# Patient Record
Sex: Female | Born: 1990 | Race: Black or African American | Hispanic: No | Marital: Single | Smoking: Current every day smoker
Health system: Southern US, Community
[De-identification: ages and names within clinical notes are randomized; demographics above are authoritative.]

## PROBLEM LIST (undated history)

## (undated) DIAGNOSIS — F419 Anxiety disorder, unspecified: Secondary | ICD-10-CM

## (undated) HISTORY — DX: Anxiety disorder, unspecified: F41.9

## (undated) HISTORY — PX: WISDOM TOOTH EXTRACTION: SHX21

---
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
## 2009-07-05 IMAGING — US US OB COMP LESS 14 WK
1 series · 14 of 28 positions shown · non-contrast
Comparison: none

OBSTETRICAL ULTRASOUND:

 This ultrasound exam was performed in the [HOSPITAL] Ultrasound Department.  The OB US report was generated in the AS system, and faxed to the ordering physician.  This report is also available in [REDACTED] PACS.

[Series 1: us ob comp less 14 wk · 0.24mm/px · 14 of 50 slices shown]
[im 2/50]
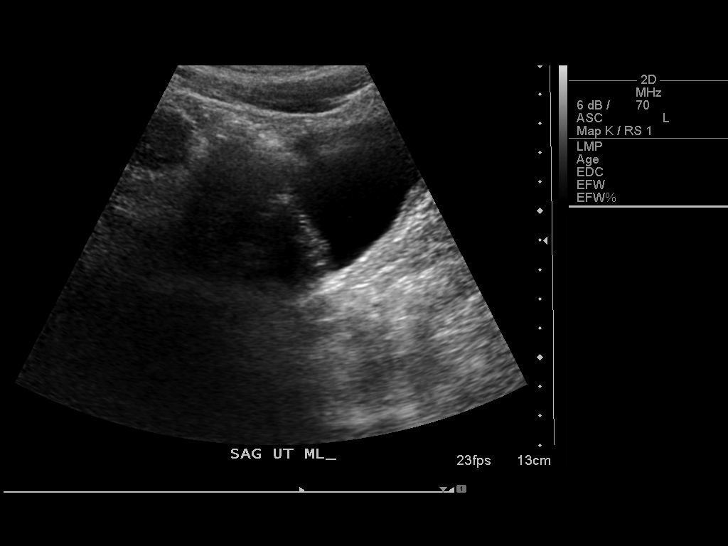
[im 6/50]
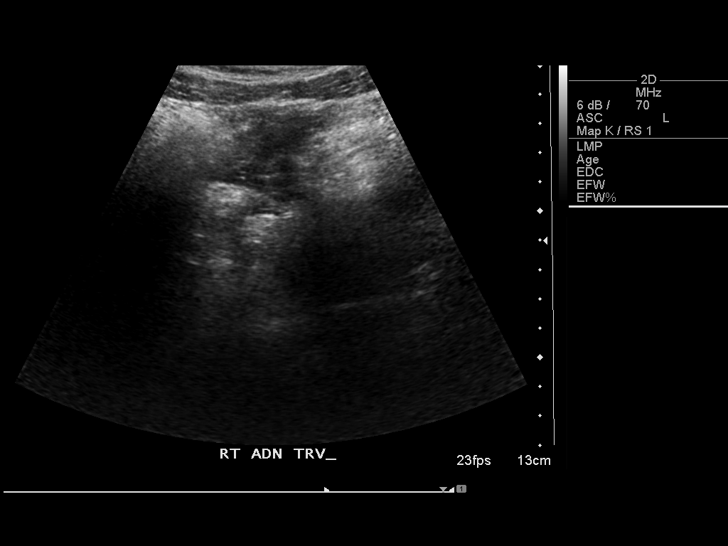
[im 10/50]
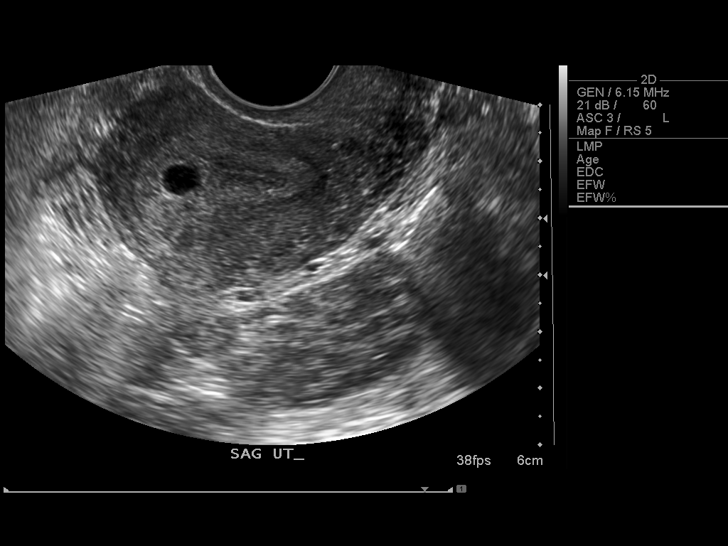
[im 13/50]
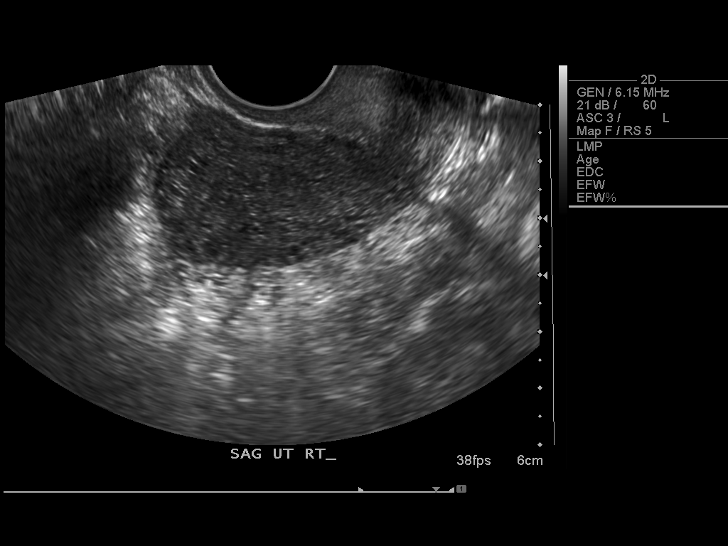
[im 17/50]
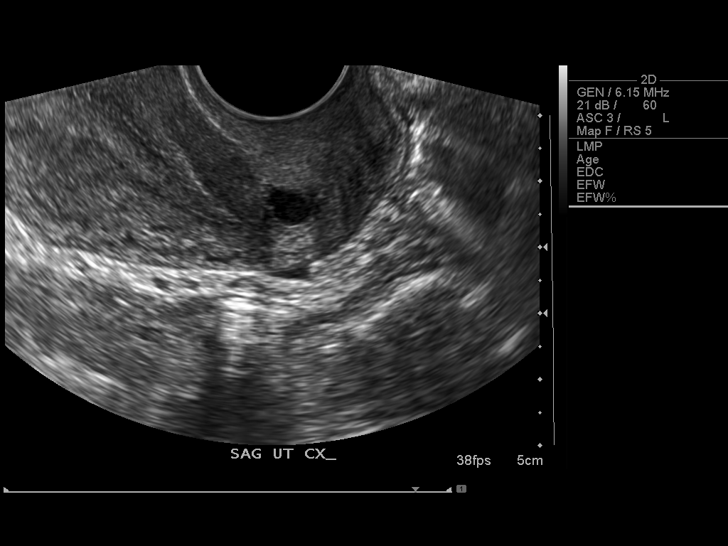
[im 20/50]
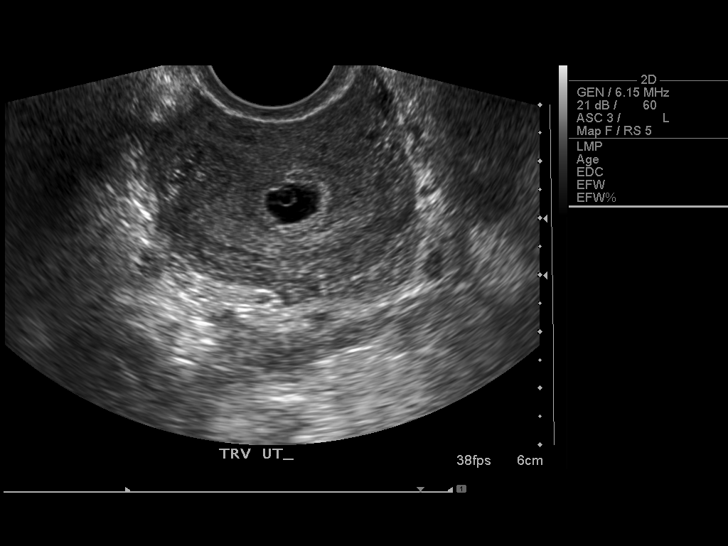
[im 24/50]
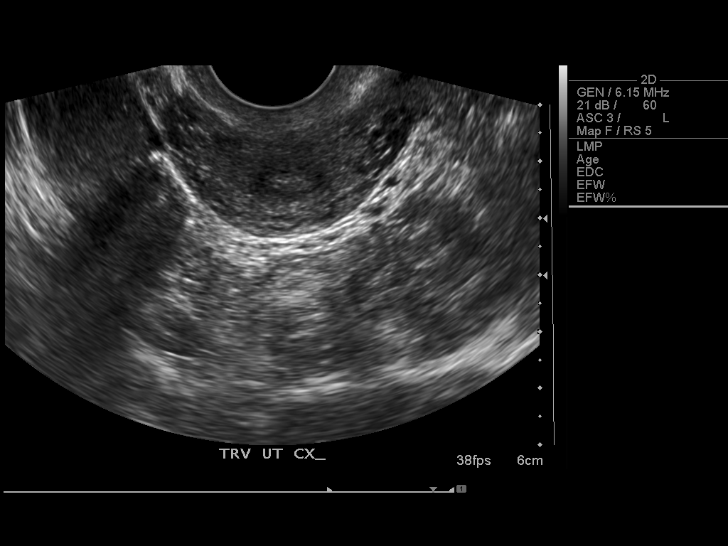
[im 28/50]
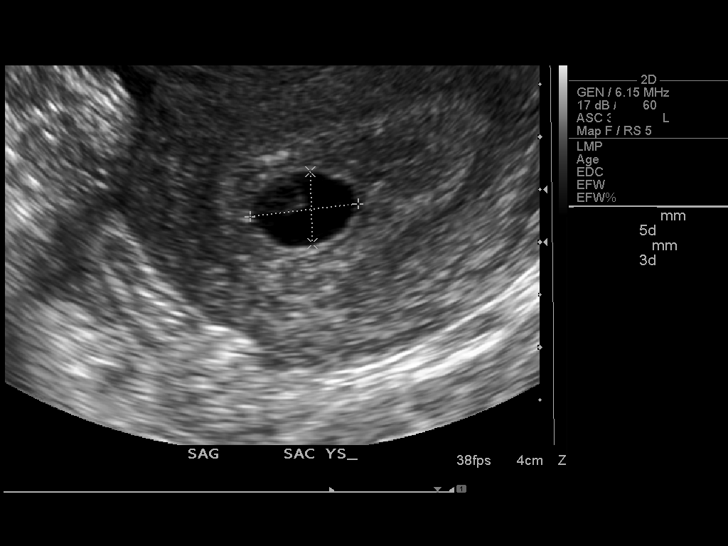
[im 31/50]
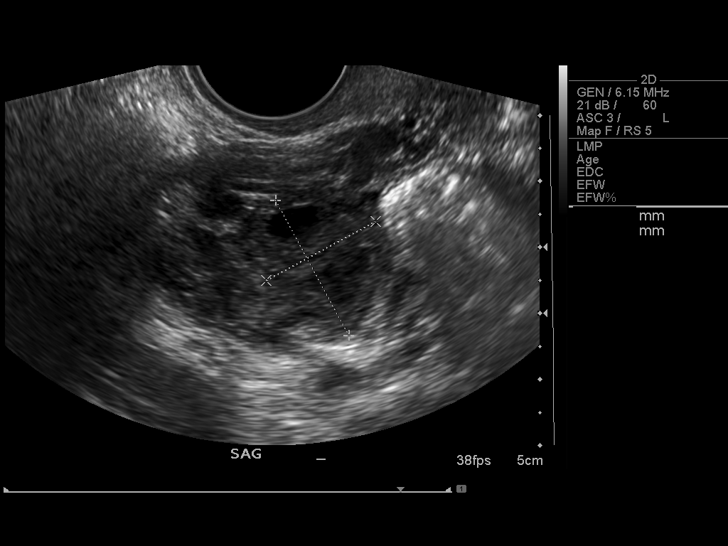
[im 35/50]
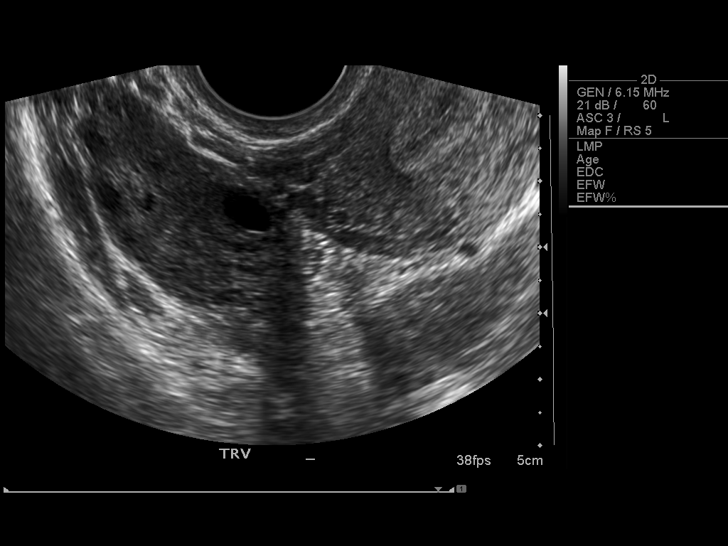
[im 39/50]
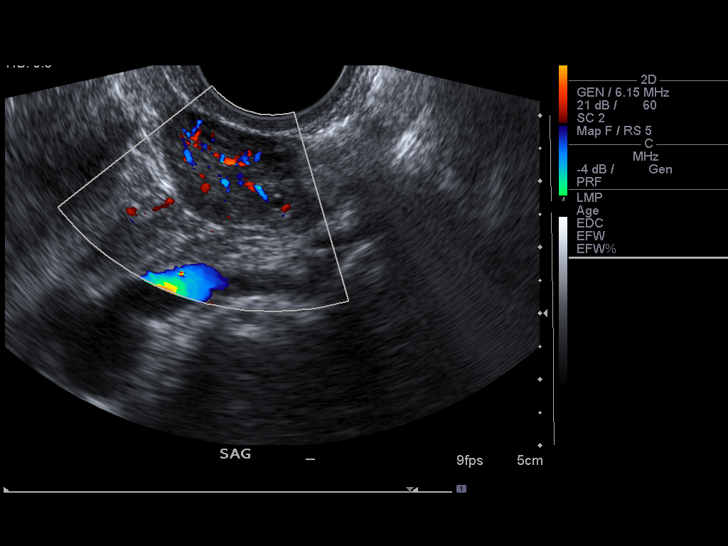
[im 42/50]
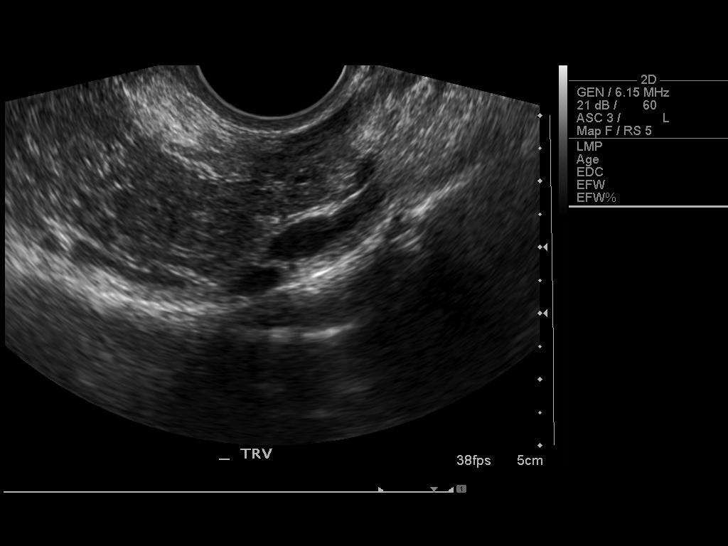
[im 46/50]
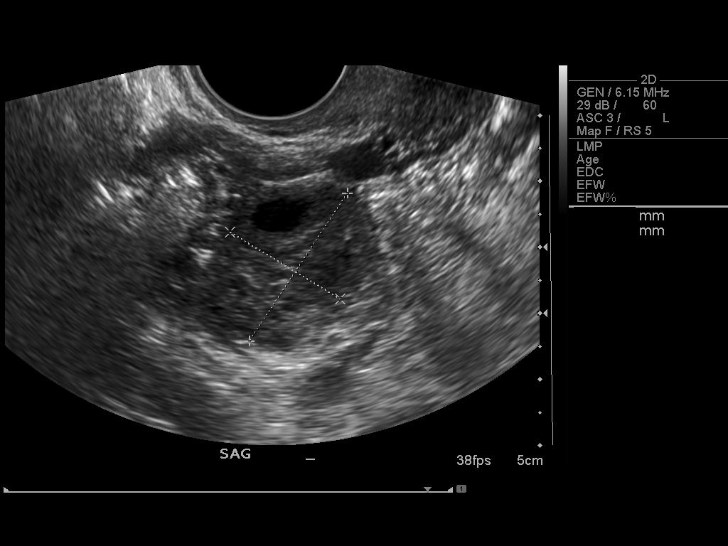
[im 50/50]
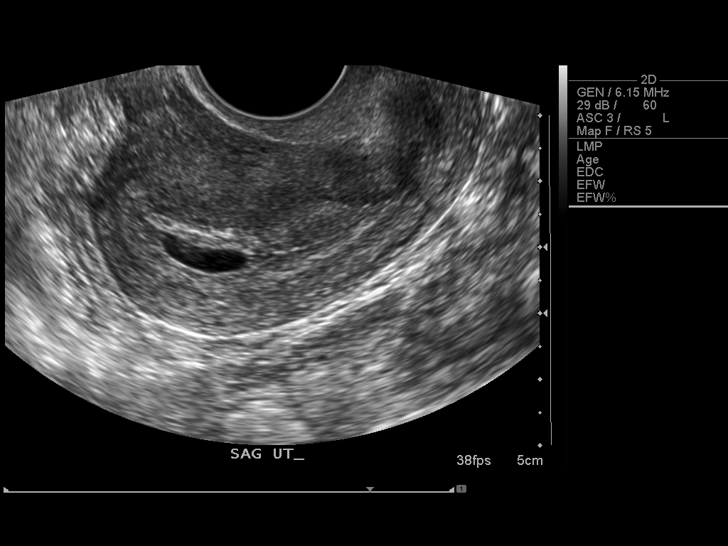

[14 of 28 positions shown; findings below may reference images not displayed]

IMPRESSION: See AS Obstetric US report.

## 2011-07-13 LAB — POCT PREGNANCY, URINE
Operator id: 196461
Preg Test, Ur: POSITIVE

## 2011-07-14 LAB — URINE MICROSCOPIC-ADD ON: RBC / HPF: NONE SEEN

## 2011-07-14 LAB — URINALYSIS, ROUTINE W REFLEX MICROSCOPIC
Bilirubin Urine: NEGATIVE
Glucose, UA: NEGATIVE
Glucose, UA: NEGATIVE
Hgb urine dipstick: NEGATIVE
Ketones, ur: NEGATIVE
Ketones, ur: NEGATIVE
Nitrite: NEGATIVE
Protein, ur: NEGATIVE
Protein, ur: NEGATIVE
Specific Gravity, Urine: >1.03 — ABNORMAL HIGH
Urobilinogen, UA: 0.2
pH: 5.5

## 2011-07-14 LAB — CBC
HCT: 36.8
Hemoglobin: 12.4
Hemoglobin: 12.4
MCHC: 33.8
MCV: 90.9
RBC: 3.97
RDW: 12.6
RDW: 12.8

## 2011-07-14 LAB — WET PREP, GENITAL
Clue Cells Wet Prep HPF POC: NONE SEEN
Clue Cells Wet Prep HPF POC: NONE SEEN
Yeast Wet Prep HPF POC: NONE SEEN

## 2011-07-14 LAB — GC/CHLAMYDIA PROBE AMP, GENITAL
Chlamydia, DNA Probe: NEGATIVE
Chlamydia, DNA Probe: NEGATIVE
GC Probe Amp, Genital: NEGATIVE
GC Probe Amp, Genital: NEGATIVE

## 2011-07-14 LAB — ABO/RH: ABO/RH(D): B POS

## 2013-08-07 ENCOUNTER — Encounter (HOSPITAL_COMMUNITY): Payer: Self-pay | Admitting: Emergency Medicine

## 2013-08-07 ENCOUNTER — Emergency Department (HOSPITAL_COMMUNITY)
Admission: EM | Admit: 2013-08-07 | Discharge: 2013-08-07 | Disposition: A | Discharge status: Home / Self Care | Payer: Medicaid Other | Source: Home / Self Care | Attending: Family Medicine | Admitting: Family Medicine

## 2013-08-07 DIAGNOSIS — Y92009 Unspecified place in unspecified non-institutional (private) residence as the place of occurrence of the external cause: Secondary | ICD-10-CM

## 2013-08-07 DIAGNOSIS — S29011A Strain of muscle and tendon of front wall of thorax, initial encounter: Secondary | ICD-10-CM

## 2013-08-07 DIAGNOSIS — IMO0002 Reserved for concepts with insufficient information to code with codable children: Secondary | ICD-10-CM

## 2013-08-07 DIAGNOSIS — W19XXXA Unspecified fall, initial encounter: Secondary | ICD-10-CM

## 2013-08-07 DIAGNOSIS — W010XXA Fall on same level from slipping, tripping and stumbling without subsequent striking against object, initial encounter: Secondary | ICD-10-CM

## 2013-08-07 MED ORDER — CYCLOBENZAPRINE HCL 5 MG PO TABS: 5.0000 mg | ORAL_TABLET | Freq: Three times a day (TID) | ORAL | Status: DC | PRN

## 2013-08-07 MED ORDER — DICLOFENAC POTASSIUM 50 MG PO TABS: 50.0000 mg | ORAL_TABLET | Freq: Three times a day (TID) | ORAL | Status: DC

## 2013-08-07 NOTE — ED Notes (Signed)
C/o falling down 13 stairs Tuesday.  States she was wearing heels when she fell and landed on her back.

## 2013-08-07 NOTE — ED Provider Notes (Signed)
CSN: [id number]     Arrival date & time 08/07/13  1925 History   First MD Initiated Contact with Patient 08/07/13 2010     Chief Complaint  Patient presents with  . Fall   (Consider location/radiation/quality/duration/timing/severity/associated sxs/prior Treatment) Patient is a 22 y.o. female presenting with fall. The history is provided by the patient.  Fall This is a new problem. The current episode started 2 days ago (slipped in high heels on stairs and fell on tues, no pain until wed, diffuse soreness.). The problem has not changed since onset.Pertinent negatives include no chest pain, no abdominal pain, no headaches and no shortness of breath.    History reviewed. No pertinent past medical history. No past surgical history on file. No family history on file. History  Substance Use Topics  . Smoking status: Not on file  . Smokeless tobacco: Not on file  . Alcohol Use: Not on file   OB History   Grav Para Term Preterm Abortions TAB SAB Ect Mult Living                 Review of Systems  Constitutional: Negative.   Respiratory: Negative for shortness of breath.   Cardiovascular: Negative for chest pain.  Gastrointestinal: Negative.  Negative for abdominal pain.  Musculoskeletal: Positive for back pain and myalgias. Negative for gait problem, neck pain and neck stiffness.  Skin: Negative.   Neurological: Negative for light-headedness, numbness and headaches.    Allergies  Review of patient's allergies indicates no known allergies.  Home Medications  No current outpatient prescriptions on file. BP 121/74  Pulse 76  Temp(Src) 98.7 F (37.1 C) (Oral)  Resp 18  SpO2 100%  LMP 07/09/2013 Physical Exam  Nursing note and vitals reviewed. Constitutional: She is oriented to person, place, and time. She appears well-developed and well-nourished.  HENT:  Head: Normocephalic and atraumatic.  Neck: Normal range of motion. Neck supple.  Pulmonary/Chest: She exhibits no  tenderness.  Abdominal: Bowel sounds are normal.  Musculoskeletal: Normal range of motion. She exhibits no tenderness.       Arms: Neurological: She is alert and oriented to person, place, and time.  Skin: Skin is warm and dry. No rash noted. No erythema.  Psychiatric: Her mood appears anxious.    ED Course  Procedures (including critical care time) Labs Review Labs Reviewed - No data to display Imaging Review No results found.    MDM      Linna Hoff, MD 08/07/13 2022

## 2013-08-26 ENCOUNTER — Encounter: Payer: Self-pay | Admitting: Advanced Practice Midwife

## 2013-09-08 ENCOUNTER — Encounter: Payer: Self-pay | Admitting: Advanced Practice Midwife

## 2013-09-23 ENCOUNTER — Ambulatory Visit (INDEPENDENT_AMBULATORY_CARE_PROVIDER_SITE_OTHER): Payer: Medicaid Other | Admitting: Obstetrics

## 2013-09-23 ENCOUNTER — Ambulatory Visit: Payer: Self-pay | Admitting: Advanced Practice Midwife

## 2013-09-23 ENCOUNTER — Encounter: Payer: Self-pay | Admitting: Obstetrics

## 2013-09-23 VITALS — BP 119/83 | HR 116 | Temp 98.6°F | Ht 64.0 in | Wt 115.0 lb

## 2013-09-23 DIAGNOSIS — Z113 Encounter for screening for infections with a predominantly sexual mode of transmission: Secondary | ICD-10-CM

## 2013-09-23 DIAGNOSIS — Z3202 Encounter for pregnancy test, result negative: Secondary | ICD-10-CM

## 2013-09-23 DIAGNOSIS — Z0000 Encounter for general adult medical examination without abnormal findings: Secondary | ICD-10-CM

## 2013-09-23 LAB — POCT URINALYSIS DIPSTICK
Bilirubin, UA: NEGATIVE
Ketones, UA: NEGATIVE
Protein, UA: NEGATIVE
Spec Grav, UA: 1.02

## 2013-09-23 LAB — POCT URINE PREGNANCY: Preg Test, Ur: NEGATIVE

## 2013-09-23 NOTE — Progress Notes (Signed)
Subjective:     Jasmine Preston is a 22 y.o. female here for a routine exam.  Current complaints: concerned about history of abortions  and history HPV, history of abnormal pap smears, also would like to talk about contraception .  Personal health questionnaire reviewed: yes.   Gynecologic History Patient's last menstrual period was 09/11/2013. Contraception: none Last Pap: . Results were:  Last mammogram:  Results were:   Obstetric History OB History  No data available     The following portions of the patient's history were reviewed and updated as appropriate: allergies, current medications, past family history, past medical history, past social history, past surgical history and problem list.  Review of Systems Pertinent items are noted in HPI.    Objective:    General appearance: alert and no distress Breasts: normal appearance, no masses or tenderness Abdomen: normal findings: soft, non-tender Pelvic: cervix normal in appearance, external genitalia normal, no adnexal masses or tenderness, no cervical motion tenderness, rectovaginal septum normal, uterus normal size, shape, and consistency and vagina normal without discharge    Assessment:    Healthy female exam.    Plan:    Education reviewed: safe sex/STD prevention and contraceptive options. Follow up in: 1 year.

## 2013-09-24 LAB — WET PREP BY MOLECULAR PROBE: Gardnerella vaginalis: POSITIVE — AB

## 2013-09-24 LAB — PAP IG W/ RFLX HPV ASCU

## 2013-09-24 LAB — HEPATITIS C ANTIBODY: HCV Ab: NEGATIVE

## 2013-09-24 LAB — GC/CHLAMYDIA PROBE AMP: GC Probe RNA: NEGATIVE

## 2013-09-24 LAB — RPR

## 2013-09-24 LAB — HEPATITIS B SURFACE ANTIGEN: Hepatitis B Surface Ag: NEGATIVE

## 2013-09-29 NOTE — Progress Notes (Signed)
Flagyl 500mg po bid x 7days

## 2013-10-02 ENCOUNTER — Other Ambulatory Visit: Payer: Self-pay | Admitting: *Deleted

## 2013-10-02 DIAGNOSIS — B9689 Other specified bacterial agents as the cause of diseases classified elsewhere: Secondary | ICD-10-CM

## 2013-10-02 MED ORDER — METRONIDAZOLE 500 MG PO TABS: 500.0000 mg | ORAL_TABLET | Freq: Two times a day (BID) | ORAL | Status: DC

## 2013-10-11 ENCOUNTER — Emergency Department (HOSPITAL_COMMUNITY)
Admission: EM | Admit: 2013-10-11 | Discharge: 2013-10-11 | Disposition: A | Discharge status: Home / Self Care | Payer: Medicaid Other | Source: Home / Self Care | Attending: Family Medicine | Admitting: Family Medicine

## 2013-10-11 ENCOUNTER — Encounter (HOSPITAL_COMMUNITY): Payer: Self-pay | Admitting: Emergency Medicine

## 2013-10-11 ENCOUNTER — Emergency Department (INDEPENDENT_AMBULATORY_CARE_PROVIDER_SITE_OTHER): Payer: Medicaid Other

## 2013-10-11 DIAGNOSIS — M546 Pain in thoracic spine: Secondary | ICD-10-CM

## 2013-10-11 NOTE — ED Notes (Signed)
Spoke with patient at discharge desk.  She complains of right upper back pain worse with movement or deep breaths.  Patient speaking in complete sentences without difficulty.  Patient states she fell down steps 2-3 weeks ago and has pain intermittently.  Patient also wants to know why she did not have an x-ray on her initial visit after her fall.  Patient explained the risks of x-rays and the need of a physician order to perform them.  Patient asked to wait in waiting room and to let staff know if anything changed

## 2013-10-11 NOTE — ED Provider Notes (Signed)
Jasmine Preston is a 22 y.o. female who presents to Urgent Care today for right posterior chest wall pain. This is been worse over the past 3 days. Patient fell about 8 weeks ago and was seen in the urgent care. X-rays were not ordered at that time as they were likely unnecessary. However she's had pain off and on since then. The pain worsened 3 days ago. She notes pain on the medial border of her right scapula with deep inspiration and shoulder motion. She denies any significant radiating pain weakness or numbness. She tried taking 800 mg of ibuprofen 2 days ago once which did not help. She has not tried any of the medications treatment for therapy since. No nausea vomiting diarrhea. No significant shortness of breath.   Past Medical History  Diagnosis Date  . Anxiety    History  Substance Use Topics  . Smoking status: Current Every Day Smoker  . Smokeless tobacco: Not on file     Comment: trying to quit  . Alcohol Use: Yes     Comment: occasional , liquor or mixed drink socially   ROS as above Medications reviewed. No current facility-administered medications for this encounter.   Current Outpatient Prescriptions  Medication Sig Dispense Refill  . cyclobenzaprine (FLEXERIL) 5 MG tablet Take 1 tablet (5 mg total) by mouth 3 (three) times daily as needed for muscle spasms.  30 tablet  0  . diclofenac (CATAFLAM) 50 MG tablet Take 1 tablet (50 mg total) by mouth 3 (three) times daily. As needed for pain  30 tablet  0    Exam:  BP 121/79  Pulse 81  Temp(Src) 98.4 F (36.9 C) (Oral)  Resp 18  SpO2 100%  LMP 09/11/2013 Gen: Well NAD HEENT: EOMI,  MMM Lungs: Normal work of breathing. CTABL Chest wall: Posterior chest wall mildly tender to palpation. Right scapular winging present with arm abduction.  Pain improved with scapular assist arm abduction Heart: RRR no MRG Abd: NABS, Soft. NT, ND Exts: Non edematous BL  LE, warm and well perfused.   No results found for this or any  previous visit (from the past 24 hour(s)). Dg Ribs Unilateral W/chest Right  10/11/2013   CLINICAL DATA:  Right posterior chest pain.  Fall 8 weeks ago.  EXAM: RIGHT RIBS AND CHEST - 3+ VIEW  COMPARISON:  None.  FINDINGS: No fracture or other bone lesions are seen involving the ribs. There is no evidence of pneumothorax or pleural effusion. Both lungs are clear. Heart size and mediastinal contours are within normal limits.  IMPRESSION: Negative.   Electronically Signed   By: Charlett Nose M.D.   On: 10/11/2013 11:46    Assessment and Plan: 22 y.o. female with right posterior chest wall pain. Likely due to rhomboid muscle dysfunction. Patient additionally has a component of scapular dysfunction. Plan to treat with Flexeril and diclofenac. Additionally use a heating pad and  home exercise program. If not improved followup with Dr. Katrinka Blazing at [facility]. Discussed warning signs or symptoms. Please see discharge instructions. Patient expresses understanding.      Rodolph Bong, MD 10/11/13 [phone]

## 2013-11-20 ENCOUNTER — Other Ambulatory Visit: Payer: Self-pay | Admitting: *Deleted

## 2013-11-20 DIAGNOSIS — Z309 Encounter for contraceptive management, unspecified: Secondary | ICD-10-CM

## 2013-11-20 MED ORDER — MEDROXYPROGESTERONE ACETATE 150 MG/ML IM SUSP: 150.0000 mg | INTRAMUSCULAR | Status: DC

## 2013-11-21 ENCOUNTER — Ambulatory Visit (INDEPENDENT_AMBULATORY_CARE_PROVIDER_SITE_OTHER): Payer: Medicaid Other | Admitting: *Deleted

## 2013-11-21 VITALS — BP 114/74 | HR 77 | Temp 98.8°F | Ht 64.0 in | Wt 108.0 lb

## 2013-11-21 DIAGNOSIS — Z3049 Encounter for surveillance of other contraceptives: Secondary | ICD-10-CM

## 2013-11-21 DIAGNOSIS — L539 Erythematous condition, unspecified: Secondary | ICD-10-CM

## 2013-11-21 DIAGNOSIS — Z3202 Encounter for pregnancy test, result negative: Secondary | ICD-10-CM

## 2013-11-21 LAB — POCT URINE PREGNANCY: Preg Test, Ur: NEGATIVE

## 2013-11-21 MED ORDER — MEDROXYPROGESTERONE ACETATE 150 MG/ML IM SUSP
150.0000 mg | INTRAMUSCULAR | Status: AC
  Administered 2013-11-21 – 2014-10-14 (×2): 150 mg via INTRAMUSCULAR

## 2013-11-21 NOTE — Progress Notes (Signed)
Patient in office today for depo shot. UPT: Negative.

## 2013-12-04 ENCOUNTER — Ambulatory Visit: Payer: Medicaid Other | Admitting: Obstetrics

## 2014-02-12 ENCOUNTER — Ambulatory Visit (INDEPENDENT_AMBULATORY_CARE_PROVIDER_SITE_OTHER): Payer: Medicaid Other | Admitting: *Deleted

## 2014-02-12 ENCOUNTER — Ambulatory Visit: Payer: Medicaid Other

## 2014-02-12 VITALS — BP 116/69 | HR 71 | Wt 110.0 lb

## 2014-02-12 DIAGNOSIS — Z309 Encounter for contraceptive management, unspecified: Secondary | ICD-10-CM

## 2014-02-12 NOTE — Progress Notes (Signed)
Pt is in office today for depo injection.  Injection given in RUOQ. Pt tolerated well.  Pt advised to RTO on 05/06/14 for next injection.

## 2014-02-16 ENCOUNTER — Ambulatory Visit: Payer: Medicaid Other

## 2014-05-06 ENCOUNTER — Ambulatory Visit: Payer: Medicaid Other

## 2014-05-15 ENCOUNTER — Telehealth: Payer: Self-pay | Admitting: *Deleted

## 2014-05-15 NOTE — Telephone Encounter (Signed)
Pt called to office for return call.  Return call made to pt.  Pt states that she missed her appt for her depo injection.  Pt states that she is now having some spotting and does not wish to have her cycles due to pain.  Pt made aware of depo policy.  Pt advised to come to office for UPT and nurse may be able to f/u with Dr Clearance CootsHarper regarding injection.  Pt advised to know when she last had intercourse in order to let nurse know dates for accuracy.  Pt states understanding and appt made for UPT on Monday 05/18/14.

## 2014-07-07 ENCOUNTER — Other Ambulatory Visit (INDEPENDENT_AMBULATORY_CARE_PROVIDER_SITE_OTHER): Payer: Medicaid Other

## 2014-07-07 VITALS — BP 110/73 | HR 99 | Temp 98.5°F | Wt 107.8 lb

## 2014-07-07 DIAGNOSIS — Z3042 Encounter for surveillance of injectable contraceptive: Secondary | ICD-10-CM

## 2014-07-07 DIAGNOSIS — Z3202 Encounter for pregnancy test, result negative: Secondary | ICD-10-CM

## 2014-07-07 DIAGNOSIS — Z3049 Encounter for surveillance of other contraceptives: Secondary | ICD-10-CM

## 2014-07-07 LAB — POCT URINE PREGNANCY: PREG TEST UR: NEGATIVE

## 2014-07-07 NOTE — Progress Notes (Signed)
Patient is in the office today for a UPT, UPT preformed, results were negative. Patient states she would like to restart her DEPO Injections. Patient advised that she would need to abstain from sexual intercourse for two weeks and that when she came back we would do another UPT and that we could give her the Injection at that time. Patient notified that it is important to do it this way because we have today's negative test and that as long as she abstains from sexual intercourse and has the second negative UPT then we can insure that there is no possibility that she is pregnant. Patient voiced understanding. Patient notified to make an appointment with the front for a UPT/ DEPO restart.   Results for orders placed in visit on 07/07/14 (from the past 24 hour(s))  POCT URINE PREGNANCY     Status: None   Collection Time    07/07/14  2:03 PM      Result Value Ref Range   Preg Test, Ur Negative     BP 110/73  Pulse 99  Temp(Src) 98.5 F (36.9 C)  Wt 107 lb 12.8 oz (48.898 kg)

## 2014-07-21 ENCOUNTER — Ambulatory Visit: Payer: Medicaid Other

## 2014-07-22 ENCOUNTER — Ambulatory Visit (INDEPENDENT_AMBULATORY_CARE_PROVIDER_SITE_OTHER): Payer: Medicaid Other | Admitting: *Deleted

## 2014-07-22 VITALS — BP 96/59 | HR 80 | Temp 97.9°F | Wt 110.0 lb

## 2014-07-22 DIAGNOSIS — Z3202 Encounter for pregnancy test, result negative: Secondary | ICD-10-CM

## 2014-07-22 DIAGNOSIS — Z3049 Encounter for surveillance of other contraceptives: Secondary | ICD-10-CM

## 2014-07-22 DIAGNOSIS — Z3042 Encounter for surveillance of injectable contraceptive: Secondary | ICD-10-CM

## 2014-07-22 NOTE — Progress Notes (Signed)
Pt is in office for Depo restart.  Pt was in office 2 weeks ago with Negative UPT.  UPT in office today is negative.  Pt made aware of results and that Depo can be given.  Injection given in right upper outer quadrant.  Pt tolerated well. Pt advised to RTO 10-13-14 for next injection.    BP 96/59  Pulse 80  Temp(Src) 97.9 F (36.6 C)  Wt 110 lb (49.896 kg)

## 2014-10-13 ENCOUNTER — Ambulatory Visit: Payer: Medicaid Other

## 2014-10-13 ENCOUNTER — Other Ambulatory Visit: Payer: Self-pay | Admitting: *Deleted

## 2014-10-13 DIAGNOSIS — Z3042 Encounter for surveillance of injectable contraceptive: Secondary | ICD-10-CM

## 2014-10-13 MED ORDER — MEDROXYPROGESTERONE ACETATE 150 MG/ML IM SUSP: 150.0000 mg | INTRAMUSCULAR | Status: DC

## 2014-10-14 ENCOUNTER — Ambulatory Visit: Payer: Medicaid Other

## 2014-10-14 ENCOUNTER — Other Ambulatory Visit: Payer: Self-pay | Admitting: Obstetrics

## 2014-10-14 ENCOUNTER — Ambulatory Visit (INDEPENDENT_AMBULATORY_CARE_PROVIDER_SITE_OTHER): Payer: Medicaid Other | Admitting: *Deleted

## 2014-10-14 VITALS — BP 100/68 | HR 61 | Temp 98.5°F | Ht 63.0 in | Wt 116.0 lb

## 2014-10-14 DIAGNOSIS — Z3042 Encounter for surveillance of injectable contraceptive: Secondary | ICD-10-CM

## 2014-10-14 NOTE — Progress Notes (Signed)
Patient is in the office today for her DEPO Injection. Patient is on time for her Injection. Injection given in Left Upper Outer Quadrant. Patient tolerated well. Patient notified to return on January 06, 2015 for her DEPO and Annual exam. Patient voiced understanding and made an appointment with the front.   BP 100/68 mmHg  Pulse 61  Temp(Src) 98.5 F (36.9 C)  Ht 5\' 3"  (1.6 m)  Wt 116 lb (52.617 kg)  BMI 20.55 kg/m2  LMP 10/13/2014  Administrations This Visit    medroxyPROGESTERone (DEPO-PROVERA) injection 150 mg    Administered Action Dose Route Administered By         10/14/2014 Given 150 mg Intramuscular Odessa FlemingKristina M Kathryne Ramella, LPN

## 2014-10-15 ENCOUNTER — Other Ambulatory Visit: Payer: Self-pay | Admitting: Obstetrics

## 2014-10-15 LAB — GC/CHLAMYDIA PROBE AMP
CT Probe RNA: NEGATIVE
GC Probe RNA: POSITIVE — AB

## 2014-10-15 LAB — URINE CULTURE
Colony Count: NO GROWTH
Organism ID, Bacteria: NO GROWTH

## 2014-10-27 ENCOUNTER — Encounter: Payer: Self-pay | Admitting: *Deleted

## 2014-10-29 ENCOUNTER — Other Ambulatory Visit: Payer: Self-pay | Admitting: *Deleted

## 2014-10-29 ENCOUNTER — Ambulatory Visit (INDEPENDENT_AMBULATORY_CARE_PROVIDER_SITE_OTHER): Payer: Medicaid Other | Admitting: *Deleted

## 2014-10-29 VITALS — BP 109/70 | HR 80 | Temp 98.4°F | Wt 116.0 lb

## 2014-10-29 DIAGNOSIS — A549 Gonococcal infection, unspecified: Secondary | ICD-10-CM

## 2014-10-29 MED ORDER — DOXYCYCLINE HYCLATE 100 MG PO CAPS: 100.0000 mg | ORAL_CAPSULE | Freq: Two times a day (BID) | ORAL | Status: DC

## 2014-10-29 MED ORDER — AZITHROMYCIN 250 MG PO TABS: 1000.0000 mg | ORAL_TABLET | Freq: Once | ORAL | Status: DC

## 2014-10-29 MED ORDER — METRONIDAZOLE 500 MG PO TABS: 500.0000 mg | ORAL_TABLET | Freq: Two times a day (BID) | ORAL | Status: DC

## 2014-10-29 MED ORDER — CEFTRIAXONE SODIUM 1 G IJ SOLR
250.0000 mg | Freq: Once | INTRAMUSCULAR | Status: AC
  Administered 2014-10-29: 250 mg via INTRAMUSCULAR

## 2014-10-30 NOTE — Progress Notes (Signed)
Pt is in office today for Rocephin injection for +GC.  Pt has been made aware of Rx treatment and intercourse recommendations.  Pt states that she is going to urgent care for treatment of partner today.  Pt has questions as to when she contracted this STD.  Pt made aware that there is no timeframe that can be given since her last testing was over 1 year ago.  Pt is very concerned with this result.  Pt states that she has been having prolong uterine bleeding and is now having sever pelvic pains.  Pt made aware that both of these symptoms could be from this + result.  Pt made aware that review with Dr Clearance CootsHarper will be done while she is in office since onset of pelvic pain.   After review with Dr Clearance CootsHarper, pt was made aware of his recommendations.  Doxycycline 100mg  and Metronidazole 500mg  were both sent to pharmacy for a 2 week course treatment.   Azithromycin previously sent to pharmacy.  Rocephin injection given today.  Pt tolerated well.  Pt has appt scheduled for AEX in next few months.   BP 109/70 mmHg  Pulse 80  Temp(Src) 98.4 F (36.9 C)  Wt 116 lb (52.617 kg)  LMP 10/13/2014    Administrations This Visit    cefTRIAXone (ROCEPHIN) injection 250 mg    Administered Action Dose Route Administered By         10/29/2014 Given 250 mg Intramuscular Lanney GinsSuzanne D Keylen Uzelac, CMA

## 2014-11-12 ENCOUNTER — Ambulatory Visit: Payer: Medicaid Other | Admitting: Obstetrics

## 2014-11-20 ENCOUNTER — Emergency Department (INDEPENDENT_AMBULATORY_CARE_PROVIDER_SITE_OTHER)
Admission: EM | Admit: 2014-11-20 | Discharge: 2014-11-20 | Disposition: A | Discharge status: Home / Self Care | Payer: Medicaid Other | Source: Home / Self Care | Attending: Family Medicine | Admitting: Family Medicine

## 2014-11-20 ENCOUNTER — Encounter (HOSPITAL_COMMUNITY): Payer: Self-pay

## 2014-11-20 DIAGNOSIS — M546 Pain in thoracic spine: Secondary | ICD-10-CM

## 2014-11-20 MED ORDER — METAXALONE 800 MG PO TABS: 800.0000 mg | ORAL_TABLET | Freq: Three times a day (TID) | ORAL | Status: DC

## 2014-11-20 MED ORDER — IBUPROFEN 800 MG PO TABS: 800.0000 mg | ORAL_TABLET | Freq: Three times a day (TID) | ORAL | Status: DC

## 2014-11-20 NOTE — ED Notes (Addendum)
C/o pain in mid t-spine area x 1 week no new injury. Pain comes and goes. Had a fall 6 months ago, w injury to back, pain did resolve w/o treatment other than muscle relaxer

## 2014-11-20 NOTE — ED Provider Notes (Signed)
CSN: [id number]     Arrival date & time 11/20/14  4098 History   First MD Initiated Contact with Patient 11/20/14 1009     Chief Complaint  Patient presents with  . Back Pain   (Consider location/radiation/quality/duration/timing/severity/associated sxs/prior Treatment) Patient is a 24 y.o. female presenting with back pain. The history is provided by the patient.  Back Pain Location:  Thoracic spine Quality:  Stabbing and stiffness Radiates to:  Does not radiate Pain severity:  Mild Onset quality:  Sudden Duration:  5 days Progression:  Unchanged Chronicity:  New Relieved by:  Nothing Worsened by:  Nothing tried Ineffective treatments:  None tried Associated symptoms: no chest pain     Past Medical History  Diagnosis Date  . Anxiety    Past Surgical History  Procedure Laterality Date  . Wisdom tooth extraction Right    Family History  Problem Relation Age of Onset  . Cancer Father   . Hypertension Father    History  Substance Use Topics  . Smoking status: Current Every Day Smoker -- 0.50 packs/day    Types: Cigarettes  . Smokeless tobacco: Not on file     Comment: trying to quit  . Alcohol Use: 0.0 oz/week    0 Not specified per week     Comment: occasional , liquor or mixed drink socially   OB History    Gravida Para Term Preterm AB TAB SAB Ectopic Multiple Living   Review of Systems  Constitutional: Negative.   Respiratory: Negative.   Cardiovascular: Negative for chest pain.  Gastrointestinal: Negative.   Musculoskeletal: Positive for back pain.    Allergies  Review of patient's allergies indicates no known allergies.  Home Medications   Prior to Admission medications   Medication Sig Start Date End Date Taking? Authorizing Provider  azithromycin (ZITHROMAX) 250 MG tablet Take 4 tablets (1,000 mg total) by mouth once. Patient not taking: Reported on 10/29/2014 10/29/14   Brock Bad, MD  cetirizine (ZYRTEC) 10 MG tablet  Take 10 mg by mouth daily.    Historical Provider, MD  cyclobenzaprine (FLEXERIL) 5 MG tablet Take 1 tablet (5 mg total) by mouth 3 (three) times daily as needed for muscle spasms. Patient not taking: Reported on 10/14/2014 08/07/13   Linna Hoff, MD  diclofenac (CATAFLAM) 50 MG tablet Take 1 tablet (50 mg total) by mouth 3 (three) times daily. As needed for pain Patient not taking: Reported on 10/14/2014 08/07/13   Linna Hoff, MD  doxycycline (VIBRAMYCIN) 100 MG capsule Take 1 capsule (100 mg total) by mouth 2 (two) times daily. For 14 days 10/29/14   Brock Bad, MD  fluticasone [facility]) 50 MCG/ACT nasal spray Place into both nostrils daily.    Historical Provider, MD  medroxyPROGESTERone (DEPO-PROVERA) 150 MG/ML injection Inject 1 mL (150 mg total) into the muscle every 3 (three) months. Patient not taking: Reported on 10/29/2014 10/13/14   Brock Bad, MD  metaxalone (SKELAXIN) 800 MG tablet Take 1 tablet (800 mg total) by mouth 3 (three) times daily. 11/20/14   Linna Hoff, MD  metroNIDAZOLE (FLAGYL) 500 MG tablet Take 1 tablet (500 mg total) by mouth 2 (two) times daily. For 14 days 10/29/14   Brock Bad, MD   BP 100/73 mmHg  Pulse 81  Temp(Src) 98.6 F (37 C) (Oral)  SpO2 99% Physical Exam  Constitutional: She is oriented to person, place,  and time. She appears well-developed and well-nourished. No distress.  Neck: Normal range of motion. Neck supple.  Cardiovascular: Normal heart sounds and intact distal pulses.   Pulmonary/Chest: Effort normal and breath sounds normal.  Abdominal: Soft. Bowel sounds are normal.  Musculoskeletal: She exhibits tenderness.       Arms: Neurological: She is alert and oriented to person, place, and time.  Skin: Skin is warm and dry.  Nursing note and vitals reviewed.   ED Course  Procedures (including critical care time) Labs Review Labs Reviewed - No data to display  Imaging Review No results found.   MDM   1. Left-sided  thoracic back pain        Linna HoffJames D Hoorain Kozakiewicz, MD 11/20/14 1029

## 2014-11-20 NOTE — Discharge Instructions (Signed)
Heat , stretch and medicine as prescribed.

## 2014-11-26 ENCOUNTER — Emergency Department (HOSPITAL_COMMUNITY)
Admission: EM | Admit: 2014-11-26 | Discharge: 2014-11-26 | Disposition: A | Discharge status: Home / Self Care | Payer: Medicaid Other | Source: Home / Self Care | Attending: Family Medicine | Admitting: Family Medicine

## 2014-11-26 ENCOUNTER — Encounter (HOSPITAL_COMMUNITY): Payer: Self-pay | Admitting: Emergency Medicine

## 2014-11-26 DIAGNOSIS — L501 Idiopathic urticaria: Secondary | ICD-10-CM

## 2014-11-26 MED ORDER — DEXAMETHASONE SODIUM PHOSPHATE 10 MG/ML IJ SOLN
INTRAMUSCULAR | Status: AC
  Filled 2014-11-26: qty 1

## 2014-11-26 MED ORDER — DEXAMETHASONE SODIUM PHOSPHATE 10 MG/ML IJ SOLN
10.0000 mg | Freq: Once | INTRAMUSCULAR | Status: AC
  Administered 2014-11-26: 10 mg via INTRAMUSCULAR

## 2014-11-26 MED ORDER — PREDNISONE 5 MG PO KIT: PACK | ORAL | Status: DC

## 2014-11-26 NOTE — ED Notes (Signed)
Generalized rash, whelps, itching all over her body, onset yesterday 2/3

## 2014-11-26 NOTE — ED Provider Notes (Signed)
CSN: [id number]     Arrival date & time 11/26/14  1959 History   First MD Initiated Contact with Patient 11/26/14 2027     Chief Complaint  Patient presents with  . Rash   (Consider location/radiation/quality/duration/timing/severity/associated sxs/prior Treatment) HPI Comments: 24 year old female developed pruritic wheals primarily to her upper body torso and extremities yesterday. These lesions stented, and go. Very pruritic. She is on aware of any allergies. Denies swelling. Denies trouble breathing.   Past Medical History  Diagnosis Date  . Anxiety    Past Surgical History  Procedure Laterality Date  . Wisdom tooth extraction Right    Family History  Problem Relation Age of Onset  . Cancer Father   . Hypertension Father    History  Substance Use Topics  . Smoking status: Current Every Day Smoker -- 0.50 packs/day    Types: Cigarettes  . Smokeless tobacco: Not on file     Comment: trying to quit  . Alcohol Use: 0.0 oz/week    0 Not specified per week     Comment: occasional , liquor or mixed drink socially   OB History    Gravida Para Term Preterm AB TAB SAB Ectopic Multiple Living   '6 2 2  4 3 1   2     '$ Review of Systems  Constitutional: Negative.   HENT: Negative.   Respiratory: Negative for cough, choking, chest tightness, shortness of breath, wheezing and stridor.   Cardiovascular: Negative.   Gastrointestinal: Negative.   Skin: Positive for rash.    Allergies  Review of patient's allergies indicates no known allergies.  Home Medications   Prior to Admission medications   Medication Sig Start Date End Date Taking? Authorizing Provider  cetirizine (ZYRTEC) 10 MG tablet Take 10 mg by mouth daily.    Historical Provider, MD  doxycycline (VIBRAMYCIN) 100 MG capsule Take 1 capsule (100 mg total) by mouth 2 (two) times daily. For 14 days 10/29/14   Shelly Bombard, MD  fluticasone [facility]) 50 MCG/ACT nasal spray Place into both nostrils daily.    Historical  Provider, MD  ibuprofen (ADVIL,MOTRIN) 800 MG tablet Take 1 tablet (800 mg total) by mouth 3 (three) times daily. 11/20/14   Billy Fischer, MD  metaxalone (SKELAXIN) 800 MG tablet Take 1 tablet (800 mg total) by mouth 3 (three) times daily. 11/20/14   Billy Fischer, MD  metroNIDAZOLE (FLAGYL) 500 MG tablet Take 1 tablet (500 mg total) by mouth 2 (two) times daily. For 14 days 10/29/14   Shelly Bombard, MD  PredniSONE 5 MG KIT Take as directed for 12 days 11/26/14   Janne Napoleon, NP   BP 123/80 mmHg  Temp(Src) 99.2 F (37.3 C) (Oral)  Resp 18  SpO2 100% Physical Exam  Constitutional: She is oriented to person, place, and time. She appears well-developed and well-nourished. No distress.  HENT:  Mouth/Throat: Oropharynx is clear and moist. No oropharyngeal exudate.  No intraoral swelling including buccal cavity and tongue  Eyes: Conjunctivae and EOM are normal.  Neck: Normal range of motion. Neck supple.  Cardiovascular: Normal rate, regular rhythm and normal heart sounds.   Pulmonary/Chest: Effort normal and breath sounds normal. No respiratory distress. She has no wheezes. She has no rales. She exhibits no tenderness.  Musculoskeletal: She exhibits no edema.  Neurological: She is alert and oriented to person, place, and time.  Skin: Skin is warm and dry.  Nursing note and vitals reviewed.   ED Course  Procedures (including  critical care time) Labs Review Labs Reviewed - No data to display  Imaging Review No results found.   MDM   1. Urticaria, idiopathic    Zyrtec daily Decadron 10 mg IM now Sterapred 5 mg 1 kit F/U with PCP    Janne Napoleon, NP 11/26/14 0932

## 2014-11-26 NOTE — Discharge Instructions (Signed)
Hives Take the zyrtec daily Hives are itchy, red, swollen areas of the skin. They can vary in size and location on your body. Hives can come and go for hours or several days (acute hives) or for several weeks (chronic hives). Hives do not spread from person to person (noncontagious). They may get worse with scratching, exercise, and emotional stress. CAUSES   Allergic reaction to food, additives, or drugs.  Infections, including the common cold.  Illness, such as vasculitis, lupus, or thyroid disease.  Exposure to sunlight, heat, or cold.  Exercise.  Stress.  Contact with chemicals. SYMPTOMS   Red or white swollen patches on the skin. The patches may change size, shape, and location quickly and repeatedly.  Itching.  Swelling of the hands, feet, and face. This may occur if hives develop deeper in the skin. DIAGNOSIS  Your caregiver can usually tell what is wrong by performing a physical exam. Skin or blood tests may also be done to determine the cause of your hives. In some cases, the cause cannot be determined. TREATMENT  Mild cases usually get better with medicines such as antihistamines. Severe cases may require an emergency epinephrine injection. If the cause of your hives is known, treatment includes avoiding that trigger.  HOME CARE INSTRUCTIONS   Avoid causes that trigger your hives.  Take antihistamines as directed by your caregiver to reduce the severity of your hives. Non-sedating or low-sedating antihistamines are usually recommended. Do not drive while taking an antihistamine.  Take any other medicines prescribed for itching as directed by your caregiver.  Wear loose-fitting clothing.  Keep all follow-up appointments as directed by your caregiver. SEEK MEDICAL CARE IF:   You have persistent or severe itching that is not relieved with medicine.  You have painful or swollen joints. SEEK IMMEDIATE MEDICAL CARE IF:   You have a fever.  Your tongue or lips are  swollen.  You have trouble breathing or swallowing.  You feel tightness in the throat or chest.  You have abdominal pain. These problems may be the first sign of a life-threatening allergic reaction. Call your local emergency services (911 in [place] MAKE SURE YOU:   Understand these instructions.  Will watch your condition.  Will get help right away if you are not doing well or get worse. Document Released: 10/09/2005 Document Revised: 10/14/2013 Document Reviewed: 01/02/2012 [facility] Patient Information 2015 [employer] [place]. This information is not intended to replace advice given to you by your health care provider. Make sure you discuss any questions you have with your health care provider.

## 2014-12-17 ENCOUNTER — Ambulatory Visit (INDEPENDENT_AMBULATORY_CARE_PROVIDER_SITE_OTHER): Payer: Medicaid Other | Admitting: Obstetrics

## 2014-12-17 ENCOUNTER — Encounter: Payer: Self-pay | Admitting: Obstetrics

## 2014-12-17 VITALS — BP 91/56 | HR 81 | Temp 98.9°F | Ht 64.0 in | Wt 116.0 lb

## 2014-12-17 DIAGNOSIS — M549 Dorsalgia, unspecified: Secondary | ICD-10-CM

## 2014-12-17 DIAGNOSIS — A5403 Gonococcal cervicitis, unspecified: Secondary | ICD-10-CM

## 2014-12-17 DIAGNOSIS — A549 Gonococcal infection, unspecified: Secondary | ICD-10-CM

## 2014-12-18 DIAGNOSIS — A5403 Gonococcal cervicitis, unspecified: Secondary | ICD-10-CM | POA: Insufficient documentation

## 2014-12-18 NOTE — Progress Notes (Signed)
Patient not seen by physician. H/O positive GC, treated in January 2016. Presents today for F/U.  A/P:  1.   GC cervicitis.  Treated ~ a month ago.                F/U in 2 months for TOC cultures.          2.   Backache.  Urine culture sent.  Coral Ceoharles Adalynne Steffensmeier MD

## 2014-12-19 LAB — URINE CULTURE

## 2015-01-06 ENCOUNTER — Other Ambulatory Visit: Payer: Self-pay | Admitting: *Deleted

## 2015-01-06 ENCOUNTER — Other Ambulatory Visit: Payer: Self-pay | Admitting: Obstetrics

## 2015-01-06 ENCOUNTER — Ambulatory Visit: Payer: Medicaid Other

## 2015-01-06 DIAGNOSIS — Z3042 Encounter for surveillance of injectable contraceptive: Secondary | ICD-10-CM

## 2015-01-06 NOTE — Telephone Encounter (Signed)
Patient called for RF of her depo Provera. Patient is overdue her annual exam. LM on VM- will RF her Depo- but patient must have her annual exam before the following injection. Rx sent to pharmacy.

## 2015-01-11 ENCOUNTER — Ambulatory Visit: Payer: Medicaid Other | Admitting: Obstetrics

## 2015-01-20 ENCOUNTER — Other Ambulatory Visit: Payer: Medicaid Other

## 2015-01-20 ENCOUNTER — Ambulatory Visit: Payer: Medicaid Other

## 2015-01-21 ENCOUNTER — Other Ambulatory Visit (INDEPENDENT_AMBULATORY_CARE_PROVIDER_SITE_OTHER): Payer: Medicaid Other | Admitting: *Deleted

## 2015-01-21 VITALS — BP 109/60 | HR 86 | Temp 99.0°F | Ht 64.0 in | Wt 110.0 lb

## 2015-01-21 DIAGNOSIS — Z3042 Encounter for surveillance of injectable contraceptive: Secondary | ICD-10-CM | POA: Diagnosis not present

## 2015-01-21 LAB — POCT URINE PREGNANCY: Preg Test, Ur: NEGATIVE

## 2015-01-21 NOTE — Progress Notes (Signed)
Patient is in the office today for UPT, DEPO Restart. This is the patients first UPT, Results were negative. Patient notified that she would need to abstain from sexual intercourse for 2 weeks and then we would have her come back in the office for another UPT and we would give her the DEPO that day. Patient voiced understanding. Patient states she has already picked her DEPO up. Patient notified to make an appointment with the front for 2 weeks. Patient voiced understanding.   BP 109/60 mmHg  Pulse 86  Temp(Src) 99 F (37.2 C)  Ht 5\' 4"  (1.626 m)  Wt 110 lb (49.896 kg)  BMI 18.87 kg/m2  Results for orders placed or performed in visit on 01/21/15 (from the past 24 hour(s))  POCT urine pregnancy     Status: None   Collection Time: 01/21/15 10:22 AM  Result Value Ref Range   Preg Test, Ur Negative

## 2015-01-25 ENCOUNTER — Encounter (HOSPITAL_COMMUNITY): Payer: Self-pay | Admitting: Emergency Medicine

## 2015-01-25 ENCOUNTER — Emergency Department (HOSPITAL_COMMUNITY)
Admission: EM | Admit: 2015-01-25 | Discharge: 2015-01-25 | Disposition: A | Discharge status: Home / Self Care | Payer: Medicaid Other | Source: Home / Self Care | Attending: Family Medicine | Admitting: Family Medicine

## 2015-01-25 DIAGNOSIS — J111 Influenza due to unidentified influenza virus with other respiratory manifestations: Secondary | ICD-10-CM

## 2015-01-25 MED ORDER — LEVOFLOXACIN 500 MG PO TABS: 500.0000 mg | ORAL_TABLET | Freq: Every day | ORAL | Status: DC

## 2015-01-25 NOTE — ED Notes (Signed)
Pt. Stated, i started feeling bad a week ago, and it all got better except this cough.

## 2015-01-25 NOTE — ED Provider Notes (Signed)
CSN: [id number]     Arrival date & time 01/25/15  1720 History   First MD Initiated Contact with Patient 01/25/15 1844     Chief Complaint  Patient presents with  . Cough   (Consider location/radiation/quality/duration/timing/severity/associated sxs/prior Treatment) Patient is a 24 y.o. female presenting with cough.  Cough Cough characteristics:  Non-productive and dry Severity:  Mild Onset quality:  Gradual Duration:  5 days Progression:  Unchanged Chronicity:  New Smoker: yes   Context: smoke exposure   Ineffective treatments:  Cough suppressants Associated symptoms: rhinorrhea   Associated symptoms: no fever, no shortness of breath, no sinus congestion, no sore throat and no wheezing     Past Medical History  Diagnosis Date  . Anxiety    Past Surgical History  Procedure Laterality Date  . Wisdom tooth extraction Right    Family History  Problem Relation Age of Onset  . Cancer Father   . Hypertension Father    History  Substance Use Topics  . Smoking status: Current Every Day Smoker -- 0.50 packs/day    Types: Cigarettes  . Smokeless tobacco: Not on file     Comment: trying to quit  . Alcohol Use: 0.0 oz/week    0 Standard drinks or equivalent per week     Comment: occasional , liquor or mixed drink socially   OB History    Gravida Para Term Preterm AB TAB SAB Ectopic Multiple Living   _0 Review of Systems  Constitutional: Negative.  Negative for fever.  HENT: Positive for congestion and rhinorrhea. Negative for sore throat.   Respiratory: Positive for cough. Negative for shortness of breath and wheezing.   Cardiovascular: Negative.   Gastrointestinal: Negative.   Skin: Negative.     Allergies  Review of patient's allergies indicates no known allergies.  Home Medications   Prior to Admission medications   Medication Sig Start Date End Date Taking? Authorizing Provider  cetirizine (ZYRTEC) 10 MG tablet Take 10 mg by mouth daily.     Historical Provider, MD  ibuprofen (ADVIL,MOTRIN) 800 MG tablet Take 1 tablet (800 mg total) by mouth 3 (three) times daily. 11/20/14   Billy Fischer, MD  levofloxacin (LEVAQUIN) 500 MG tablet Take 1 tablet (500 mg total) by mouth daily. 01/25/15   Billy Fischer, MD  medroxyPROGESTERone (DEPO-PROVERA) 150 MG/ML injection INJECT 1 ML (150 MG TOTAL) INTO THE MUSCLE EVERY 3 (THREE) MONTHS. 01/06/15   Shelly Bombard, MD  PredniSONE 5 MG KIT Take as directed for 12 days 11/26/14   Janne Napoleon, NP   BP 121/86 mmHg  Pulse 81  Temp(Src) 99.5 F (37.5 C) (Oral)  Resp 18  SpO2 98% Physical Exam  Constitutional: She is oriented to person, place, and time. She appears well-developed and well-nourished. No distress.  HENT:  Head: Normocephalic.  Right Ear: External ear normal.  Left Ear: External ear normal.  Mouth/Throat: Oropharynx is clear and moist.  Eyes: Conjunctivae are normal. Pupils are equal, round, and reactive to light.  Neck: Normal range of motion. Neck supple.  Cardiovascular: Normal rate, regular rhythm, normal heart sounds and intact distal pulses.   Pulmonary/Chest: Effort normal and breath sounds normal. She has no wheezes. She has no rales.  Lymphadenopathy:    She has no cervical adenopathy.  Neurological: She is alert and oriented to person, place, and time.  Skin: Skin is warm and dry.  Nursing note and vitals  reviewed.   ED Course  Procedures (including critical care time) Labs Review Labs Reviewed - No data to display  Imaging Review No results found.   MDM   1. Bronchitis with influenza        Billy Fischer, MD 01/25/15 [phone]

## 2015-01-25 NOTE — Discharge Instructions (Signed)
Take all of medicine, drink lots of fluids, no more smoking, see your doctor if further problems  °

## 2015-01-28 ENCOUNTER — Encounter: Payer: Self-pay | Admitting: Obstetrics

## 2015-01-28 ENCOUNTER — Ambulatory Visit (INDEPENDENT_AMBULATORY_CARE_PROVIDER_SITE_OTHER): Payer: Medicaid Other | Admitting: Obstetrics

## 2015-01-28 VITALS — BP 105/61 | HR 80 | Temp 98.2°F | Wt 112.0 lb

## 2015-01-28 DIAGNOSIS — Z124 Encounter for screening for malignant neoplasm of cervix: Secondary | ICD-10-CM

## 2015-01-28 DIAGNOSIS — Z0000 Encounter for general adult medical examination without abnormal findings: Secondary | ICD-10-CM | POA: Diagnosis not present

## 2015-01-28 DIAGNOSIS — Z3042 Encounter for surveillance of injectable contraceptive: Secondary | ICD-10-CM | POA: Diagnosis not present

## 2015-01-28 DIAGNOSIS — Z01419 Encounter for gynecological examination (general) (routine) without abnormal findings: Secondary | ICD-10-CM | POA: Diagnosis not present

## 2015-01-28 MED ORDER — MEDROXYPROGESTERONE ACETATE 150 MG/ML IM SUSP: INTRAMUSCULAR | Status: DC

## 2015-01-28 MED ORDER — LEVOFLOXACIN 500 MG PO TABS: 500.0000 mg | ORAL_TABLET | Freq: Every day | ORAL | Status: DC

## 2015-01-28 NOTE — Progress Notes (Addendum)
Subjective:        Jasmine Preston is a 24 y.o. female here for a routine exam.  Current complaints: none.    Personal health questionnaire:  Is patient [name], have a family history of breast and/or ovarian cancer: no Is there a family history of uterine cancer diagnosed at age < 13, gastrointestinal cancer, urinary tract cancer, family member who is a Field seismologist syndrome-associated carrier: no Is the patient overweight and hypertensive, family history of diabetes, personal history of gestational diabetes, preeclampsia or PCOS: no Is patient over 72, have PCOS,  family history of premature CHD under age 41, diabetes, smoke, have hypertension or peripheral artery disease:  no At any time, has a partner hit, kicked or otherwise hurt or frightened you?: no Over the past 2 weeks, have you felt down, depressed or hopeless?: no Over the past 2 weeks, have you felt little interest or pleasure in doing things?:no   Gynecologic History No LMP recorded. Patient has had an injection. Contraception: Depo-Provera injections Last Pap: 2014. Results were: normal Last mammogram: n/a. Results were: n/a  Obstetric History OB History  Gravida Para Term Preterm AB SAB TAB Ectopic Multiple Living  _0 # Outcome Date GA Lbr Len/2nd Weight Sex Delivery Anes PTL Lv  6 Term 12/13/10 [redacted]w[redacted]d 6 lb 6 oz (2.892 kg) F Vag-Spont None  Y  5 TAB 2012 161w0d     N  4 TAB 2012        N  3 Term 11/09/08 3953w0d lb 13 oz (2.637 kg) M Vag-Spont None  Y  2 TAB 2010 7w016w0d   N  1 SAB 2009 [id number]  N      Past Medical History  Diagnosis Date  . Anxiety     Past Surgical History  Procedure Laterality Date  . Wisdom tooth extraction Right      Current outpatient prescriptions:  .  levofloxacin (LEVAQUIN) 500 MG tablet, Take 1 tablet (500 mg total) by mouth daily., Disp: 7 tablet, Rfl: 0 .  cetirizine (ZYRTEC) 10 MG tablet, Take 10 mg by mouth daily., Disp: , Rfl:  .  ibuprofen  (ADVIL,MOTRIN) 800 MG tablet, Take 1 tablet (800 mg total) by mouth 3 (three) times daily. (Patient not taking: Reported on 01/28/2015), Disp: 30 tablet, Rfl: 0 .  medroxyPROGESTERone (DEPO-PROVERA) 150 MG/ML injection, INJECT 1 ML (150 MG TOTAL) INTO THE MUSCLE EVERY 3 (THREE) MONTHS., Disp: 1 mL, Rfl: 3 .  PredniSONE 5 MG KIT, Take as directed for 12 days, Disp: 1 kit, Rfl: 0 No Known Allergies  History  Substance Use Topics  . Smoking status: Former Smoker -- 0.50 packs/day    Types: Cigarettes  . Smokeless tobacco: Not on file     Comment: trying to quit  . Alcohol Use: 0.0 oz/week    0 Standard drinks or equivalent per week     Comment: occasional , liquor or mixed drink socially    Family History  Problem Relation Age of Onset  . Cancer Father   . Hypertension Father       Review of Systems  Constitutional: negative for fatigue and weight loss Respiratory: negative for cough and wheezing Cardiovascular: negative for chest pain, fatigue and palpitations Gastrointestinal: negative for abdominal pain and change in bowel habits Musculoskeletal:negative for myalgias Neurological: negative for gait problems and tremors  Behavioral/Psych: negative for abusive relationship, depression Endocrine: negative for temperature intolerance   Genitourinary:negative for abnormal menstrual periods, genital lesions, hot flashes, sexual problems and vaginal discharge Integument/breast: negative for breast lump, breast tenderness, nipple discharge and skin lesion(s)    Objective:       BP 105/61 mmHg  Pulse 80  Temp(Src) 98.2 F (36.8 C)  Wt 112 lb (50.803 kg) General:   alert  Skin:   no rash or abnormalities  Lungs:   clear to auscultation bilaterally  Heart:   regular rate and rhythm, S1, S2 normal, no murmur, click, rub or gallop  Breasts:   normal without suspicious masses, skin or nipple changes or axillary nodes  Abdomen:  normal findings: no organomegaly, soft, non-tender and no  hernia  Pelvis:  External genitalia: normal general appearance Urinary system: urethral meatus normal and bladder without fullness, nontender Vaginal: normal without tenderness, induration or masses Cervix: normal appearance Adnexa: normal bimanual exam Uterus: anteverted and non-tender, normal size   Lab Review Urine pregnancy test Labs reviewed yes Radiologic studies reviewed no    Assessment:    Healthy female exam.    Contraception Counseling   Plan:    Education reviewed: safe sex/STD prevention, self breast exams and weight bearing exercise. Contraception: Depo-Provera injections. Follow up in: 1 year.   Meds ordered this encounter  Medications  . levofloxacin (LEVAQUIN) 500 MG tablet    Sig: Take 1 tablet (500 mg total) by mouth daily.    Dispense:  7 tablet    Refill:  0  . medroxyPROGESTERone (DEPO-PROVERA) 150 MG/ML injection    Sig: INJECT 1 ML (150 MG TOTAL) INTO THE MUSCLE EVERY 3 (THREE) MONTHS.    Dispense:  1 mL    Refill:  3   Orders Placed This Encounter  Procedures  . SureSwab, Vaginosis/Vaginitis Plus

## 2015-01-29 LAB — PAP IG W/ RFLX HPV ASCU

## 2015-01-31 LAB — SURESWAB, VAGINOSIS/VAGINITIS PLUS
Atopobium vaginae: 5.4 Log (cells/mL)
C. PARAPSILOSIS, DNA: NOT DETECTED
C. TRACHOMATIS RNA, TMA: NOT DETECTED
C. TROPICALIS, DNA: NOT DETECTED
C. albicans, DNA: DETECTED — AB
C. glabrata, DNA: NOT DETECTED
GARDNERELLA VAGINALIS: 8 Log (cells/mL)
LACTOBACILLUS SPECIES: NOT DETECTED Log (cells/mL)
MEGASPHAERA SPECIES: >8 Log (cells/mL)
N. GONORRHOEAE RNA, TMA: NOT DETECTED
T. vaginalis RNA, QL TMA: NOT DETECTED

## 2015-02-01 ENCOUNTER — Other Ambulatory Visit: Payer: Self-pay | Admitting: Obstetrics

## 2015-02-01 DIAGNOSIS — B3731 Acute candidiasis of vulva and vagina: Secondary | ICD-10-CM

## 2015-02-01 DIAGNOSIS — N760 Acute vaginitis: Principal | ICD-10-CM

## 2015-02-01 DIAGNOSIS — B373 Candidiasis of vulva and vagina: Secondary | ICD-10-CM

## 2015-02-01 DIAGNOSIS — B9689 Other specified bacterial agents as the cause of diseases classified elsewhere: Secondary | ICD-10-CM

## 2015-02-01 MED ORDER — FLUCONAZOLE 150 MG PO TABS: 150.0000 mg | ORAL_TABLET | Freq: Once | ORAL | Status: DC

## 2015-02-01 MED ORDER — TINIDAZOLE 500 MG PO TABS: 1000.0000 mg | ORAL_TABLET | Freq: Every day | ORAL | Status: DC

## 2015-02-04 ENCOUNTER — Other Ambulatory Visit (INDEPENDENT_AMBULATORY_CARE_PROVIDER_SITE_OTHER): Payer: Medicaid Other

## 2015-02-04 ENCOUNTER — Other Ambulatory Visit: Payer: Medicaid Other

## 2015-02-04 VITALS — BP 104/69 | HR 102 | Wt 112.0 lb

## 2015-02-04 DIAGNOSIS — N760 Acute vaginitis: Secondary | ICD-10-CM

## 2015-02-04 DIAGNOSIS — Z3042 Encounter for surveillance of injectable contraceptive: Secondary | ICD-10-CM | POA: Diagnosis not present

## 2015-02-04 DIAGNOSIS — Z3049 Encounter for surveillance of other contraceptives: Secondary | ICD-10-CM

## 2015-02-04 DIAGNOSIS — B9689 Other specified bacterial agents as the cause of diseases classified elsewhere: Secondary | ICD-10-CM

## 2015-02-04 LAB — POCT URINE PREGNANCY: Preg Test, Ur: NEGATIVE

## 2015-02-04 MED ORDER — METRONIDAZOLE 500 MG PO TABS: 500.0000 mg | ORAL_TABLET | Freq: Two times a day (BID) | ORAL | Status: DC

## 2015-02-04 MED ORDER — MEDROXYPROGESTERONE ACETATE 150 MG/ML IM SUSP
150.0000 mg | Freq: Once | INTRAMUSCULAR | Status: AC
  Administered 2015-02-04: 150 mg via INTRAMUSCULAR

## 2015-02-04 NOTE — Progress Notes (Unsigned)
Pt is in office today for UPT and restart depo.  Pt had Negative UPT in office 2 weeks ago.  Pt had UPT today, results Negative.  Pt is able to start on depo today.  Pt tolerated injection well.   Pt reminded of policy and to stay on time for injections in order to continue medication.  Pt advised to RTO on April 28, 2015 for next injection.  Pt asked about recent labs that were done at last visit.  Pt made aware that she had a bacterial infection as well as yeast infection.  Pt made aware that medication would be at pharmacy.  Metronidazole was sent today.  Diflucan has already been sent in by Dr Clearance CootsHarper.  Pt states understanding.  BP 104/69 mmHg  Pulse 102  Wt 112 lb (50.803 kg)  Administrations This Visit    medroxyPROGESTERone (DEPO-PROVERA) injection 150 mg    Admin Date Action Dose Route Administered By         02/04/2015 Given 150 mg Intramuscular Lanney GinsSuzanne D Alannis Hsia, CMA

## 2015-02-23 ENCOUNTER — Ambulatory Visit: Payer: Medicaid Other | Admitting: Obstetrics

## 2015-04-28 ENCOUNTER — Ambulatory Visit: Payer: Medicaid Other

## 2015-04-29 ENCOUNTER — Ambulatory Visit (INDEPENDENT_AMBULATORY_CARE_PROVIDER_SITE_OTHER): Payer: Medicaid Other | Admitting: *Deleted

## 2015-04-29 ENCOUNTER — Ambulatory Visit: Payer: Medicaid Other

## 2015-04-29 VITALS — BP 126/72 | HR 82 | Temp 98.8°F | Ht 64.0 in | Wt 110.0 lb

## 2015-04-29 DIAGNOSIS — Z3040 Encounter for surveillance of contraceptives, unspecified: Secondary | ICD-10-CM | POA: Diagnosis not present

## 2015-04-29 DIAGNOSIS — Z3042 Encounter for surveillance of injectable contraceptive: Secondary | ICD-10-CM | POA: Diagnosis not present

## 2015-04-29 MED ORDER — MEDROXYPROGESTERONE ACETATE 150 MG/ML IM SUSP
150.0000 mg | INTRAMUSCULAR | Status: AC
  Administered 2015-04-29: 150 mg via INTRAMUSCULAR

## 2015-04-29 NOTE — Progress Notes (Signed)
Patient in office today for a Depo Injection. Patient is on time for her injection.  Patient tolerated injection well. Patient due for her next injection on July 21, 2015.  BP 126/72 mmHg  Pulse 82  Temp(Src) 98.8 F (37.1 C)  Ht 5\' 4"  (1.626 m)  Wt 110 lb (49.896 kg)  BMI 18.87 kg/m2   Administrations This Visit    medroxyPROGESTERone (DEPO-PROVERA) injection 150 mg    Admin Date Action Dose Route Administered By         04/29/2015 Given 150 mg Intramuscular Henriette CombsAndrea L Maxine Huynh, LPN

## 2015-07-05 ENCOUNTER — Ambulatory Visit: Payer: Medicaid Other | Admitting: Obstetrics

## 2015-07-19 ENCOUNTER — Ambulatory Visit: Payer: Medicaid Other | Admitting: Obstetrics

## 2015-07-21 ENCOUNTER — Ambulatory Visit: Payer: Medicaid Other

## 2016-01-31 ENCOUNTER — Ambulatory Visit: Payer: Medicaid Other | Admitting: Obstetrics

## 2016-03-09 ENCOUNTER — Ambulatory Visit: Payer: Medicaid Other | Admitting: Obstetrics

## 2016-04-10 ENCOUNTER — Encounter (HOSPITAL_COMMUNITY): Payer: Self-pay | Admitting: Emergency Medicine

## 2016-04-10 DIAGNOSIS — H18891 Other specified disorders of cornea, right eye: Secondary | ICD-10-CM | POA: Diagnosis not present

## 2016-04-10 DIAGNOSIS — F17210 Nicotine dependence, cigarettes, uncomplicated: Secondary | ICD-10-CM | POA: Insufficient documentation

## 2016-04-10 DIAGNOSIS — H5711 Ocular pain, right eye: Secondary | ICD-10-CM | POA: Diagnosis present

## 2016-04-10 NOTE — ED Notes (Signed)
Patient states her eye started bothering her Sunday, she fell asleep with contacts in. States her right eye is tender and hurts to keep open, "feels like something in it".

## 2016-04-11 ENCOUNTER — Emergency Department (HOSPITAL_COMMUNITY)
Admission: EM | Admit: 2016-04-11 | Discharge: 2016-04-11 | Disposition: A | Discharge status: Home / Self Care | Payer: Medicaid Other | Attending: Emergency Medicine | Admitting: Emergency Medicine

## 2016-04-11 DIAGNOSIS — H18891 Other specified disorders of cornea, right eye: Secondary | ICD-10-CM

## 2016-04-11 LAB — POC URINE PREG, ED: PREG TEST UR: NEGATIVE

## 2016-04-11 MED ORDER — LEVOFLOXACIN 0.5 % OP SOLN: OPHTHALMIC | Status: DC

## 2016-04-11 MED ORDER — FLUORESCEIN SODIUM 1 MG OP STRP
1.0000 | ORAL_STRIP | Freq: Once | OPHTHALMIC | Status: AC
  Administered 2016-04-11: 1 via OPHTHALMIC
  Filled 2016-04-11: qty 1

## 2016-04-11 MED ORDER — PROPARACAINE HCL 0.5 % OP SOLN
1.0000 [drp] | Freq: Once | OPHTHALMIC | Status: AC
  Administered 2016-04-11: 1 [drp] via OPHTHALMIC
  Filled 2016-04-11: qty 15

## 2016-04-11 NOTE — ED Provider Notes (Signed)
CSN: [id number]     Arrival date & time 04/10/16  2347 History   First MD Initiated Contact with Patient 04/11/16 0005     Chief Complaint  Patient presents with  . Eye Pain     (Consider location/radiation/quality/duration/timing/severity/associated sxs/prior Treatment) Patient is a 25 y.o. female presenting with eye pain. The history is provided by the patient.  Eye Pain This is a new problem. The current episode started yesterday. The problem occurs rarely. The problem has been gradually worsening. Associated symptoms include a visual change (blurry vision). Pertinent negatives include no abdominal pain, chills, congestion, coughing, fever, headaches, nausea, neck pain, numbness, sore throat, vertigo or weakness. Exacerbated by: light; blinking. She has tried nothing for the symptoms. Improvement on treatment: N/A.   Patient presents with gradually worsening, gradual onset, moderate, constant right eye pain. Patient reports the pain began after she slept in her contacts on Sunday. She states she has not worn her contacts all day today. Associated symptoms include photophobia and watery drainage.  She states "feels like something is in my eye". Denies headache, diplopia, fever, chills, numbness, weakness. Denies any eye trauma or injury. Nothing tried prior to arrival. No modifying factors.  Past Medical History  Diagnosis Date  . Anxiety    Past Surgical History  Procedure Laterality Date  . Wisdom tooth extraction Right    Family History  Problem Relation Age of Onset  . Cancer Father   . Hypertension Father    Social History  Substance Use Topics  . Smoking status: Current Every Day Smoker -- 0.50 packs/day    Types: Cigarettes  . Smokeless tobacco: None     Comment: trying to quit  . Alcohol Use: 0.0 oz/week    0 Standard drinks or equivalent per week     Comment: occasional , liquor or mixed drink socially   OB History    Gravida Para Term Preterm AB TAB SAB Ectopic  Multiple Living   Review of Systems  Constitutional: Negative for fever and chills.  HENT: Negative for congestion, facial swelling and sore throat.   Eyes: Positive for photophobia, pain, discharge (watery), redness and visual disturbance (blurry vision). Negative for itching.  Respiratory: Negative for cough.   Gastrointestinal: Negative for nausea and abdominal pain.  Musculoskeletal: Negative for neck pain.  Neurological: Negative for dizziness, vertigo, weakness, numbness and headaches.  All other systems reviewed and are negative.     Allergies  Review of patient's allergies indicates no known allergies.  Home Medications   Prior to Admission medications   Medication Sig Start Date End Date Taking? Authorizing Provider  levofloxacin Charlean Sanfilippo) 0.5 % ophthalmic solution Place 1-2 drops in right eye q2h while awake for 2 days, then q4-8h for 5 days. 04/11/16   Cheri Fowler, PA-C  medroxyPROGESTERone (DEPO-PROVERA) 150 MG/ML injection INJECT 1 ML (150 MG TOTAL) INTO THE MUSCLE EVERY 3 (THREE) MONTHS. 01/28/15   Brock Bad, MD   BP 104/73 mmHg  Pulse 92  Temp(Src) 99 F (37.2 C) (Oral)  Resp 14  Ht  (1.626 m)  Wt 51.823 kg  BMI 19.60 kg/m2  SpO2 100%  LMP 12/20/2015 (Approximate) Physical Exam  Constitutional: She is oriented to person, place, and time. She appears well-developed [id number]-nourished.  HENT:  Head: Normocephalic and atraumatic.  Right Ear: External ear normal.  Left Ear: External ear normal.  Eyes: EOM are normal. Pupils are equal,  round, and reactive to light. Lids are everted and swept, no foreign bodies found. Right eye exhibits discharge (watery). Right eye exhibits no chemosis, no exudate and no hordeolum. No foreign body present in the right eye. Left eye exhibits no chemosis, no discharge, no exudate and no hordeolum. No foreign body present in the left eye. Right conjunctiva is injected. Right conjunctiva has no hemorrhage.  Left conjunctiva is injected. Left conjunctiva has no hemorrhage. No scleral icterus.  Slit lamp exam:      The right eye shows corneal abrasion. The right eye shows no corneal flare, no corneal ulcer, no foreign body, no hyphema, no hypopyon and no fluorescein uptake.       The left eye shows no corneal abrasion, no corneal flare, no foreign body, no hyphema and no hypopyon.  Visual acuity 20/25 bilateral with prescription glasses  Neck: No tracheal deviation present.  Pulmonary/Chest: Effort normal. No respiratory distress.  Abdominal: She exhibits no distension.  Musculoskeletal: Normal range of motion.  Neurological: She is alert and oriented to person, place, and time.  Skin: Skin is warm and dry.  Psychiatric: She has a normal mood and affect. Her behavior is normal.    ED Course  Procedures (including critical care time) Labs Review Labs Reviewed  POC URINE PREG, ED    Imaging Review No results found. I have personally reviewed and evaluated these images and lab results as part of my medical decision-making.   EKG Interpretation None      MDM   Final diagnoses:  Corneal irritation of right eye   Patient presents with right eye irritation after sleeping in her contacts. VSS, NAD. On exam, conjunctivae injected bilaterally. PERRLA. Visual acuity 20/25 bilaterally. No fluorescein uptake. Slit-lamp exam unremarkable. Concern for possible small corneal abrasion. Discharge home with levofloxacin ophthalmic solution. Recommend ibuprofen, Advil, or Aleve for pain control. Advised avoiding contact lens use. Follow up with ophthalmology in 1-2 days. Discussed return precautions. Patient agrees and acknowledges the above plan for discharge.    Cheri FowlerKayla Yeudiel Mateo, PA-C 04/11/16 [id number]  Gilda Creasehristopher J Pollina, MD 04/12/16 [phone]

## 2016-04-11 NOTE — ED Notes (Signed)
Patient states she missed period since Feb and states she missed Depo shot in December.

## 2016-04-11 NOTE — Discharge Instructions (Signed)

## 2016-04-11 NOTE — ED Notes (Signed)
Patient verbalized understanding of discharge instructions and denies any further needs or questions at this time. VS stable. Patient ambulatory with steady gait, declined wheelchair - RN escorted patient to ED lobby.

## 2016-04-11 NOTE — ED Notes (Signed)
Visual acuity 20/25 bilateral with prescription glasses.

## 2017-02-09 ENCOUNTER — Ambulatory Visit (INDEPENDENT_AMBULATORY_CARE_PROVIDER_SITE_OTHER): Payer: Medicaid Other | Admitting: Obstetrics

## 2017-02-09 ENCOUNTER — Encounter: Payer: Self-pay | Admitting: Obstetrics

## 2017-02-09 ENCOUNTER — Other Ambulatory Visit (HOSPITAL_COMMUNITY)
Admission: RE | Admit: 2017-02-09 | Discharge: 2017-02-09 | Disposition: A | Discharge status: Home / Self Care | Payer: Medicaid Other | Source: Ambulatory Visit | Attending: Obstetrics | Admitting: Obstetrics

## 2017-02-09 VITALS — BP 108/66 | HR 82 | Wt 115.6 lb

## 2017-02-09 DIAGNOSIS — Z01419 Encounter for gynecological examination (general) (routine) without abnormal findings: Secondary | ICD-10-CM | POA: Insufficient documentation

## 2017-02-09 DIAGNOSIS — Z30011 Encounter for initial prescription of contraceptive pills: Secondary | ICD-10-CM

## 2017-02-09 DIAGNOSIS — Z113 Encounter for screening for infections with a predominantly sexual mode of transmission: Secondary | ICD-10-CM | POA: Insufficient documentation

## 2017-02-09 DIAGNOSIS — Z3009 Encounter for other general counseling and advice on contraception: Secondary | ICD-10-CM

## 2017-02-09 DIAGNOSIS — Z124 Encounter for screening for malignant neoplasm of cervix: Secondary | ICD-10-CM

## 2017-02-09 DIAGNOSIS — N944 Primary dysmenorrhea: Secondary | ICD-10-CM

## 2017-02-09 DIAGNOSIS — F17200 Nicotine dependence, unspecified, uncomplicated: Secondary | ICD-10-CM

## 2017-02-09 DIAGNOSIS — Z0000 Encounter for general adult medical examination without abnormal findings: Secondary | ICD-10-CM

## 2017-02-09 MED ORDER — VARENICLINE TARTRATE 0.5 MG X 11 & 1 MG X 42 PO MISC: ORAL | 0 refills | Status: DC

## 2017-02-09 MED ORDER — VARENICLINE TARTRATE 1 MG PO TABS: 1.0000 mg | ORAL_TABLET | Freq: Two times a day (BID) | ORAL | 2 refills | Status: DC

## 2017-02-09 MED ORDER — NORETHINDRONE-ETH ESTRADIOL 1-35 MG-MCG PO TABS: 1.0000 | ORAL_TABLET | Freq: Every day | ORAL | 11 refills | Status: DC

## 2017-02-09 MED ORDER — PRENATE MINI 29-0.6-0.4-350 MG PO CAPS: 1.0000 | ORAL_CAPSULE | Freq: Every day | ORAL | 11 refills | Status: DC

## 2017-02-09 MED ORDER — IBUPROFEN 800 MG PO TABS: 800.0000 mg | ORAL_TABLET | Freq: Three times a day (TID) | ORAL | 5 refills | Status: DC | PRN

## 2017-02-09 NOTE — Progress Notes (Signed)
Subjective:        Jasmine Preston is a 26 y.o. female here for a routine exam.  Current complaints: None.    Personal health questionnaire:  Is patient [name], have a family history of breast and/or ovarian cancer: no Is there a family history of uterine cancer diagnosed at age < 43, gastrointestinal cancer, urinary tract cancer, family member who is a Personnel officer syndrome-associated carrier: no Is the patient overweight and hypertensive, family history of diabetes, personal history of gestational diabetes, preeclampsia or PCOS: no Is patient over 101, have PCOS,  family history of premature CHD under age 61, diabetes, smoke, have hypertension or peripheral artery disease:  no At any time, has a partner hit, kicked or otherwise hurt or frightened you?: no Over the past 2 weeks, have you felt down, depressed or hopeless?: no Over the past 2 weeks, have you felt little interest or pleasure in doing things?:no   Gynecologic History Patient's last menstrual period was 01/16/2017. Contraception: condoms Last Pap: 2016. Results were: normal Last mammogram: n/a. Results were: n/a  Obstetric History OB History  Gravida Para Term Preterm AB Living  SAB TAB Ectopic Multiple Live Births  # Outcome Date GA Lbr Len/2nd Weight Sex Delivery Anes PTL Lv  6 Term 12/13/10 [redacted]w[redacted]d  6 lb 6 oz (2.892 kg) F Vag-Spont None  LIV  5 TAB 2012 [redacted]w[redacted]d       DEC  4 TAB 2012        DEC  3 Term 11/09/08 [redacted]w[redacted]d  5 lb 13 oz (2.637 kg) M Vag-Spont None  LIV  2 TAB 2010 [redacted]w[redacted]d       DEC  1 SAB 2009 [redacted]w[redacted]d       DEC      Past Medical History:  Diagnosis Date  . Anxiety     Past Surgical History:  Procedure Laterality Date  . WISDOM TOOTH EXTRACTION Right      Current Outpatient Prescriptions:  .  ibuprofen (ADVIL,MOTRIN) 800 MG tablet, Take 1 tablet (800 mg total) by mouth every 8 (eight) hours as needed., Disp: 30 tablet, Rfl: 5 .  levofloxacin (QUIXIN) 0.5 % ophthalmic  solution, Place 1-2 drops in right eye q2h while awake for 2 days, then q4-8h for 5 days. (Patient not taking: Reported on 02/09/2017), Disp: 5 mL, Rfl: 0 .  norethindrone-ethinyl estradiol 1/35 (ORTHO-NOVUM 1/35, 28,) tablet, Take 1 tablet by mouth daily., Disp: 1 Package, Rfl: 11 .  Prenat w/o A-FeCbn-Meth-FA-DHA (PRENATE MINI) 29-0.6-0.4-350 MG CAPS, Take 1 capsule by mouth daily before breakfast., Disp: 30 capsule, Rfl: 11 .  varenicline (CHANTIX CONTINUING MONTH PAK) 1 MG tablet, Take 1 tablet (1 mg total) by mouth 2 (two) times daily., Disp: 30 tablet, Rfl: 2 .  varenicline (CHANTIX STARTING MONTH PAK) 0.5 MG X 11 & 1 MG X 42 tablet, Take one 0.5 mg tablet by mouth once daily for 3 days, then increase to one 0.5 mg tablet twice daily for 4 days, then increase to one 1 mg tablet twice daily., Disp: 53 tablet, Rfl: 0 No Known Allergies  Social History  Substance Use Topics  . Smoking status: Current Every Day Smoker    Packs/day: 0.50    Types: Cigarettes  . Smokeless tobacco: Never Used     Comment: trying to quit  . Alcohol use 0.0 oz/week     Comment: occasional ,  liquor or mixed drink socially    Family History  Problem Relation Age of Onset  . Hypertension Mother   . Cancer Father   . Hypertension Father       Review of Systems  Constitutional: negative for fatigue and weight loss Respiratory: negative for cough and wheezing Cardiovascular: negative for chest pain, fatigue and palpitations Gastrointestinal: negative for abdominal pain and change in bowel habits Musculoskeletal:negative for myalgias Neurological: negative for gait problems and tremors Behavioral/Psych: negative for abusive relationship, depression Endocrine: negative for temperature intolerance    Genitourinary:negative for abnormal menstrual periods, genital lesions, hot flashes, sexual problems and vaginal discharge Integument/breast: negative for breast lump, breast tenderness, nipple discharge and skin  lesion(s)    Objective:       BP 108/66   Pulse 82   Wt 115 lb 9.6 oz (52.4 kg)   LMP 01/16/2017   BMI 19.84 kg/m  General:   alert  Skin:   no rash or abnormalities  Lungs:   clear to auscultation bilaterally  Heart:   regular rate and rhythm, S1, S2 normal, no murmur, click, rub or gallop  Breasts:   normal without suspicious masses, skin or nipple changes or axillary nodes  Abdomen:  normal findings: no organomegaly, soft, non-tender and no hernia  Pelvis:  External genitalia: normal general appearance Urinary system: urethral meatus normal and bladder without fullness, nontender Vaginal: normal without tenderness, induration or masses Cervix: normal appearance Adnexa: normal bimanual exam Uterus: anteverted and non-tender, normal size   Lab Review Urine pregnancy test Labs reviewed yes Radiologic studies reviewed no  50% of 20 min visit spent on counseling and coordination of care.    Assessment:    Healthy female exam.    Contraceptive Counseling and Advice.  Wants OCP's.  Tobacco Dependence   Plan:   Ortho Novum 1/35 Rx Chantix Rx   Education reviewed: calcium supplements, depression evaluation, low fat, low cholesterol diet, safe sex/STD prevention, self breast exams, smoking cessation and weight bearing exercise. Contraception: OCP (estrogen/progesterone). Follow up in: 1 year.   Meds ordered this encounter  Medications  . norethindrone-ethinyl estradiol 1/35 (ORTHO-NOVUM 1/35, 28,) tablet    Sig: Take 1 tablet by mouth daily.    Dispense:  1 Package    Refill:  11  . varenicline (CHANTIX STARTING MONTH PAK) 0.5 MG X 11 & 1 MG X 42 tablet    Sig: Take one 0.5 mg tablet by mouth once daily for 3 days, then increase to one 0.5 mg tablet twice daily for 4 days, then increase to one 1 mg tablet twice daily.    Dispense:  53 tablet    Refill:  0  . varenicline (CHANTIX CONTINUING MONTH PAK) 1 MG tablet    Sig: Take 1 tablet (1 mg total) by mouth 2 (two)  times daily.    Dispense:  30 tablet    Refill:  2  . Prenat w/o A-FeCbn-Meth-FA-DHA (PRENATE MINI) 29-0.6-0.4-350 MG CAPS    Sig: Take 1 capsule by mouth daily before breakfast.    Dispense:  30 capsule    Refill:  11  . ibuprofen (ADVIL,MOTRIN) 800 MG tablet    Sig: Take 1 tablet (800 mg total) by mouth every 8 (eight) hours as needed.    Dispense:  30 tablet    Refill:  5   No orders of the defined types were placed in this encounter.    Patient ID: Jasmine Preston, female   DOB: [birth year], 25  y.o.   MRN: [id number]

## 2017-02-09 NOTE — Progress Notes (Signed)
Patient is in the office for annual exam. Patient wants to get STD check.

## 2017-02-12 LAB — CYTOLOGY - PAP: DIAGNOSIS: NEGATIVE

## 2017-02-12 LAB — CERVICOVAGINAL ANCILLARY ONLY
Bacterial vaginitis: POSITIVE — AB
CHLAMYDIA, DNA PROBE: NEGATIVE
Candida vaginitis: NEGATIVE
NEISSERIA GONORRHEA: NEGATIVE
Trichomonas: NEGATIVE

## 2017-02-14 ENCOUNTER — Other Ambulatory Visit: Payer: Self-pay | Admitting: Obstetrics

## 2017-02-14 DIAGNOSIS — B9689 Other specified bacterial agents as the cause of diseases classified elsewhere: Secondary | ICD-10-CM

## 2017-02-14 DIAGNOSIS — N760 Acute vaginitis: Principal | ICD-10-CM

## 2017-02-14 MED ORDER — METRONIDAZOLE 500 MG PO TABS: 500.0000 mg | ORAL_TABLET | Freq: Two times a day (BID) | ORAL | 2 refills | Status: DC

## 2017-04-04 ENCOUNTER — Telehealth: Payer: Self-pay

## 2017-04-04 DIAGNOSIS — Z0000 Encounter for general adult medical examination without abnormal findings: Secondary | ICD-10-CM

## 2017-04-04 MED ORDER — PRENATE MINI 29-0.6-0.4-350 MG PO CAPS: 1.0000 | ORAL_CAPSULE | Freq: Every day | ORAL | 11 refills | Status: DC

## 2017-04-04 NOTE — Telephone Encounter (Signed)
Pt called requesting a refill for her prenatal vitamins. She also would like to restart chantix. Pt states that she had stopped taking this because she was missing days that through her off track. Please advise. Prenatal vitamins refilled.

## 2018-03-10 ENCOUNTER — Encounter (HOSPITAL_COMMUNITY): Payer: Self-pay | Admitting: *Deleted

## 2018-03-10 ENCOUNTER — Other Ambulatory Visit: Payer: Self-pay

## 2018-03-10 ENCOUNTER — Ambulatory Visit (HOSPITAL_COMMUNITY)
Admission: EM | Admit: 2018-03-10 | Discharge: 2018-03-10 | Disposition: A | Discharge status: Home / Self Care | Payer: Self-pay

## 2018-03-10 DIAGNOSIS — N944 Primary dysmenorrhea: Secondary | ICD-10-CM

## 2018-03-10 MED ORDER — IBUPROFEN 800 MG PO TABS: 800.0000 mg | ORAL_TABLET | Freq: Three times a day (TID) | ORAL | 0 refills | Status: DC | PRN

## 2018-03-10 MED ORDER — CYCLOBENZAPRINE HCL 10 MG PO TABS: 5.0000 mg | ORAL_TABLET | Freq: Three times a day (TID) | ORAL | 0 refills | Status: DC | PRN

## 2018-03-10 NOTE — Discharge Instructions (Addendum)
Please come back for imaging if you begin to have weakness in your lower extremities.  I have prescribed you an NSAID, which you have had before, along with flexeril.

## 2018-03-10 NOTE — ED Notes (Signed)
Pt discharged by provider.

## 2018-03-10 NOTE — ED Provider Notes (Signed)
03/10/2018 10:01 PM   DOB: 09/04/1991 / MRN: [id number]  SUBJECTIVE:  Jasmine Preston is a 27 y.o. female presenting for right sided back pain as she points to her right hip.  This has been ongoing for about a year now.  She wonders if she has had a UTI all this time.   She has No Known Allergies.   She  has a past medical history of Anxiety.    She  reports that she has been smoking cigarettes.  She has been smoking about 1.00 pack per day. She has never used smokeless tobacco. She reports that she drinks alcohol. She reports that she does not use drugs. She  reports that she currently engages in sexual activity and has had partners who are Female. She reports using the following methods of birth control/protection: None and Condom. The patient  has a past surgical history that includes Wisdom tooth extraction (Right).  Her family history includes Cancer in her father; Hypertension in her father and mother.  Review of Systems  Constitutional: Negative for chills, diaphoresis and fever.  Eyes: Negative.   Respiratory: Negative for cough, hemoptysis, sputum production, shortness of breath and wheezing.   Cardiovascular: Negative for chest pain, orthopnea and leg swelling.  Gastrointestinal: Negative for abdominal pain, blood in stool, constipation, diarrhea, heartburn, melena, nausea and vomiting.  Genitourinary: Negative for flank pain.  Musculoskeletal: Negative for back pain, falls, joint pain, myalgias and neck pain.  Skin: Negative for rash.  Neurological: Negative for dizziness, sensory change, speech change, focal weakness and headaches.    OBJECTIVE:  BP (!) 109/58 (BP Location: Left Arm)   Pulse 89   Temp 98.3 F (36.8 C) (Oral)   SpO2 99%   Wt Readings from Last 3 Encounters:  02/09/17 115 lb 9.6 oz (52.4 kg)  04/10/16 114 lb 4 oz (51.8 kg)  04/29/15 110 lb (49.9 kg)   Temp Readings from Last 3 Encounters:  03/10/18 98.3 F (36.8 C) (Oral)  04/10/16 99 F (37.2 C)  (Oral)  04/29/15 98.8 F (37.1 C)   BP Readings from Last 3 Encounters:  03/10/18 (!) 109/58  02/09/17 108/66  04/10/16 104/73   Pulse Readings from Last 3 Encounters:  03/10/18 89  02/09/17 82  04/10/16 92    Physical Exam  Constitutional: She is oriented to person, place, and time. She appears well-nourished. No distress.  Eyes: Pupils are equal, round, and reactive to light. EOM are normal.  Cardiovascular: Normal rate.  Pulmonary/Chest: Effort normal.  Abdominal: She exhibits no distension. There is no tenderness. There is no CVA tenderness.  Musculoskeletal: She exhibits tenderness. She exhibits no edema or deformity.  Neurological: She is alert and oriented to person, place, and time. No cranial nerve deficit. Gait normal.  Skin: Skin is dry. She is not diaphoretic.  Psychiatric: She has a normal mood and affect.  Vitals reviewed.   No results found for this or any previous visit (from the past 72 hour(s)).  No results found.  ASSESSMENT AND PLAN:   Primary dysmenorrhea - Plan: ibuprofen (ADVIL,MOTRIN) 800 MG tablet    Discharge Instructions     Please come back for imaging if you begin to have weakness in your lower extremities.  I have prescribed you an NSAID, which you have had before, along with flexeril.         The patient is advised to call or return to clinic if she does not see an improvement in symptoms, or to seek the  care of the closest emergency department if she worsens with the above plan.   Deliah Boston, MHS, PA-C 03/10/2018 10:01 PM   Ofilia Neas, PA-C 03/10/18 2202

## 2018-03-10 NOTE — ED Triage Notes (Addendum)
Per pt her right side of her back has been bothering her since this morning, per pt when she cough pain radiates to her pelvic region, per pt she is not taking medication and it's been 8 months ago, per pt its off and on. Per pt her pinky toe on her right foot looks infected and it hurts

## 2018-06-01 ENCOUNTER — Emergency Department (HOSPITAL_COMMUNITY)
Admission: EM | Admit: 2018-06-01 | Discharge: 2018-06-01 | Disposition: A | Discharge status: Home / Self Care | Payer: Medicaid Other | Attending: Emergency Medicine | Admitting: Emergency Medicine

## 2018-06-01 ENCOUNTER — Encounter (HOSPITAL_COMMUNITY): Payer: Self-pay

## 2018-06-01 ENCOUNTER — Other Ambulatory Visit: Payer: Self-pay

## 2018-06-01 DIAGNOSIS — M545 Low back pain, unspecified: Secondary | ICD-10-CM

## 2018-06-01 DIAGNOSIS — F17210 Nicotine dependence, cigarettes, uncomplicated: Secondary | ICD-10-CM | POA: Insufficient documentation

## 2018-06-01 LAB — URINALYSIS, ROUTINE W REFLEX MICROSCOPIC
Bilirubin Urine: NEGATIVE
GLUCOSE, UA: NEGATIVE mg/dL
Hgb urine dipstick: NEGATIVE
KETONES UR: NEGATIVE mg/dL
LEUKOCYTES UA: NEGATIVE
NITRITE: NEGATIVE
Protein, ur: NEGATIVE mg/dL
Specific Gravity, Urine: 1.019 (ref 1.005–1.030)
pH: 6 (ref 5.0–8.0)

## 2018-06-01 LAB — POC URINE PREG, ED: Preg Test, Ur: NEGATIVE

## 2018-06-01 MED ORDER — NAPROXEN 500 MG PO TABS: 500.0000 mg | ORAL_TABLET | Freq: Two times a day (BID) | ORAL | 0 refills | Status: AC

## 2018-06-01 MED ORDER — METHOCARBAMOL 500 MG PO TABS: 500.0000 mg | ORAL_TABLET | Freq: Three times a day (TID) | ORAL | 0 refills | Status: AC | PRN

## 2018-06-01 NOTE — ED Provider Notes (Signed)
[facility] EMERGENCY DEPARTMENT Provider Note   CSN: [id number] Arrival date & time: 06/01/18  1326     History   Chief Complaint Chief Complaint  Patient presents with  . Back Pain    HPI Jasmine Preston is a 27 y.o. female with a hx of tobacco abuse and anxiety who presents to the ED with complaints of intermittent back pain for past several months. Patient states pain is in the bilateral lower back, non radiating, feels like pressure/spasm/tightness, triggered by movements and heavy lifting which she does at work. Seen at urgent care for flare of this several months ago- thought to be muscle spasm. No traumatic injuries. She also reports intermittent urinary frequency/urgency for several months, not necessarily correlating with back pain. Denies numbness, tingling, weakness, incontinence to bowel/bladder, saddle anesthesia, fever, chills, IV drug use, or hx of cancer. Denies nausea, vomiting, abdominal pain, or significant abnormal discharge. Sexually active with two female partners, does not consistently use protection.    HPI  Past Medical History:  Diagnosis Date  . Anxiety     There are no active problems to display for this patient.   Past Surgical History:  Procedure Laterality Date  . WISDOM TOOTH EXTRACTION Right      OB History    Gravida  6   Para  2   Term  2   Preterm      AB  4   Living  2     SAB  1   TAB  3   Ectopic      Multiple      Live Births  2            Home Medications    Prior to Admission medications   Medication Sig Start Date End Date Taking? Authorizing Provider  cyclobenzaprine (FLEXERIL) 10 MG tablet Take 0.5-1 tablets (5-10 mg total) by mouth 3 (three) times daily as needed for muscle spasms. Do not mix with narcotics. May cause drowsiness. 03/10/18   Ofilia Neas, PA-C  ibuprofen (ADVIL,MOTRIN) 800 MG tablet Take 1 tablet (800 mg total) by mouth every 8 (eight) hours as needed. 03/10/18    Ofilia Neas, PA-C  NON FORMULARY Per pt she is taking CB1 Weight Gaining medication    [provider]    Family History Family History  Problem Relation Age of Onset  . Hypertension Mother   . Cancer Father   . Hypertension Father     Social History Social History   Tobacco Use  . Smoking status: Current Every Day Smoker    Packs/day: 1.00    Types: Cigarettes  . Smokeless tobacco: Never Used  . Tobacco comment: trying to quit  Substance Use Topics  . Alcohol use: Yes    Alcohol/week: 0.0 standard drinks    Comment: occasional , liquor or mixed drink socially  . Drug use: No     Allergies   Patient has no known allergies.   Review of Systems Review of Systems  Constitutional: Negative for chills and fever.  Respiratory: Negative for shortness of breath.   Cardiovascular: Negative for chest pain.  Gastrointestinal: Negative for nausea and vomiting.  Genitourinary: Positive for frequency and urgency. Negative for dysuria, hematuria, vaginal bleeding and vaginal discharge.  Musculoskeletal: Positive for back pain.  Neurological: Negative for weakness and numbness.       Negative for incontinence or saddle anesthesia.      Physical Exam Updated Vital Signs BP 110/61 (  BP Location: Right Arm)   Pulse (!) 105   Temp 98.6 F (37 C) (Oral)   Resp 14   Ht 5\' 3"  (1.6 m)   Wt 49.9 kg   LMP 05/04/2018   SpO2 98%   BMI 19.49 kg/m   Physical Exam  Constitutional: She appears well-developed and well-nourished. No distress.  HENT:  Head: Normocephalic and atraumatic.  Eyes: Conjunctivae are normal. Right eye exhibits no discharge. Left eye exhibits no discharge.  Cardiovascular: Normal rate and regular rhythm.  Pulmonary/Chest: Effort normal and breath sounds normal.  Abdominal: Soft. She exhibits no distension. There is no tenderness. There is no rigidity, no rebound, no guarding and no CVA tenderness.  Musculoskeletal:  No obvious deformity,  appreciable swelling, erythema, ecchymosis, or open wounds Back: No midline tenderness to palpation.  Patient has bilateral lumbar paraspinal muscle tenderness to palpation.  Neurological: She is alert.  Clear speech.  Sensation grossly intact bilateral lower extremities.  5 out of 5 strength plantar dorsiflexion bilaterally.  Patellar DTRs are 2+ and symmetric.  ambulatory with steady gait.  Psychiatric: She has a normal mood and affect. Her behavior is normal. Thought content normal.  Nursing note and vitals reviewed.    ED Treatments / Results  Labs Results for orders placed or performed during the hospital encounter of 06/01/18  Urinalysis, Routine w reflex microscopic- may I&O cath if menses  Result Value Ref Range   Color, Urine YELLOW YELLOW   APPearance HAZY (A) CLEAR   Specific Gravity, Urine 1.019 1.005 - 1.030   pH 6.0 5.0 - 8.0   Glucose, UA NEGATIVE NEGATIVE mg/dL   Hgb urine dipstick NEGATIVE NEGATIVE   Bilirubin Urine NEGATIVE NEGATIVE   Ketones, ur NEGATIVE NEGATIVE mg/dL   Protein, ur NEGATIVE NEGATIVE mg/dL   Nitrite NEGATIVE NEGATIVE   Leukocytes, UA NEGATIVE NEGATIVE  POC urine preg, ED  Result Value Ref Range   Preg Test, Ur NEGATIVE NEGATIVE   No results found.  EKG None  Radiology No results found.  Procedures Procedures (including critical care time)  Medications Ordered in ED Medications - No data to display   Initial Impression / Assessment and Plan / ED Course  I have reviewed the triage vital signs and the nursing notes.  Pertinent labs & imaging results that were available during my care of the patient were reviewed by me and considered in my medical decision making (see chart for details).   Patient presents to the ED with concerns for intermittent back pain and intermittent urinary sxs for several months, not necessarily correlating in regards to timing of each of these symptoms.   Urinalysis performed- does not appear consistent with  UTI/pyelonephritis, pregnancy test is negative. I discussed and recommended STD testing given patient's symptoms and multiple sexual partners with inconsistent use of protection- she declined this and is not concerned for STD, I again discussed that an STD could be the cause of patient's urinary symptoms and she continued to decline. No abdominal tenderness or fevers to raise concern for PID at this time.   Regarding back pain- patient has normal neurologic exam, no midline tenderness to palpation. She is ambulatory in the ED.  No back pain red flags. Most likely muscle strain versus spasm. Considered disc disease, UTI/pyelonephritis, kidney stone, aortic aneurysm/dissection, cauda equina or epidural abscess however these do not fit clinical picture at this time. Will treat with Naproxen and Robaxin, discussed with patient that they are not to drive or operate heavy machinery while  taking Robaxin. Good RX discount card provided to assist in medication affordability. I discussed treatment plan, need for PCP follow-up, and return precautions with the patient. Provided opportunity for questions, patient confirmed understanding and is in agreement with plan.   Final Clinical Impressions(s) / ED Diagnoses   Final diagnoses:  Bilateral low back pain without sciatica, unspecified chronicity    ED Discharge Orders         Ordered    methocarbamol (ROBAXIN) 500 MG tablet  Every 8 hours PRN     06/01/18 1423    naproxen (NAPROSYN) 500 MG tablet  2 times daily     06/01/18 [address] [place] R, PA-C 06/01/18 1439    Cathren Laine, MD 06/01/18 1459

## 2018-06-01 NOTE — Discharge Instructions (Addendum)
Back Pain:  Tests performed today:  - Urinalysis- you do not have a urinary tract infection - Pregnancy test- negative  Medications:  - Naproxen is a nonsteroidal anti-inflammatory medication that will help with pain and swelling. Be sure to take this medication as prescribed with food, 1 pill every 12 hours,  It should be taken with food, as it can cause stomach upset, and more seriously, stomach bleeding. Do not take other nonsteroidal anti-inflammatory medications with this such as Advil, Motrin, Aleve, Mobic, Goodie Powder, or Motrin.    - Robaxin is the muscle relaxer I have prescribed, this is meant to help with muscle tightness. Be aware that this medication may make you drowsy therefore the first time you take this it should be at a time you are in an environment where you can rest. Do not drive or operate heavy machinery when taking this medication. Do not drink alcohol or take other sedating medications with this medicine such as narcotics or benzodiazepines.   You make take Tylenol per over the counter dosing with these medications.   We have prescribed you new medication(s) today. Discuss the medications prescribed today with your pharmacist as they can have adverse effects and interactions with your other medicines including over the counter and prescribed medications. Seek medical evaluation if you start to experience new or abnormal symptoms after taking one of these medicines, seek care immediately if you start to experience difficulty breathing, feeling of your throat closing, facial swelling, or rash as these could be indications of a more serious allergic reaction   Please utilize attached discount card for lower prices on these medicines. If you are unable to afford both medicines I would recommend purchasing the Naproxen.   The application of heat can help soothe the pain.  Maintaining your daily activities, including walking, is encourged, as it will help you get better faster  than just staying in bed.  Low back pain is discomfort in the lower back that may be due to injuries to muscles and ligaments around the spine.  Occasionally, it may be caused by a a problem to a part of the spine called a disc.  The pain may last several days or a few weeks.   Your pain should get better over the next 2 weeks.  You will need to follow up with  Your primary healthcare provider in 1-2 weeks for reassessment, if you do not have a primary care provider one is provided in your discharge instructions. However if you develop severe or worsening pain, low back pain with fever, numbness, weakness, loss of bowel or bladder control, or inability to walk or urinate, you should return to the ER immediately.  Please follow up with your doctor this week for a recheck if still having symptoms.

## 2018-06-01 NOTE — ED Triage Notes (Signed)
Pt states that she has been having back pain, [facility] and told that she was having spasms. Pt states concern for UTI reporting frequency. Also reports that she stands on her feet a lot for work.

## 2018-06-01 NOTE — ED Notes (Signed)
Pt refused pelvic exam at this time.

## 2018-06-01 NOTE — ED Notes (Signed)
Patient ambulatory to bathroom with steady gait at this time to provide urine sample
